# Patient Record
Sex: Male | Born: 1960 | Hispanic: No | Marital: Married | Smoking: Current every day smoker
Health system: Southern US, Community
[De-identification: ages and names within clinical notes are randomized; demographics above are authoritative.]

## PROBLEM LIST (undated history)

## (undated) ENCOUNTER — Ambulatory Visit: Source: Home / Self Care

## (undated) DIAGNOSIS — M1990 Unspecified osteoarthritis, unspecified site: Secondary | ICD-10-CM

## (undated) DIAGNOSIS — I10 Essential (primary) hypertension: Secondary | ICD-10-CM

## (undated) DIAGNOSIS — A048 Other specified bacterial intestinal infections: Secondary | ICD-10-CM

## (undated) DIAGNOSIS — F329 Major depressive disorder, single episode, unspecified: Secondary | ICD-10-CM

## (undated) DIAGNOSIS — K219 Gastro-esophageal reflux disease without esophagitis: Secondary | ICD-10-CM

## (undated) DIAGNOSIS — F32A Depression, unspecified: Secondary | ICD-10-CM

## (undated) DIAGNOSIS — G40909 Epilepsy, unspecified, not intractable, without status epilepticus: Secondary | ICD-10-CM

## (undated) HISTORY — DX: Major depressive disorder, single episode, unspecified: F32.9

## (undated) HISTORY — DX: Other specified bacterial intestinal infections: A04.8

## (undated) HISTORY — DX: Depression, unspecified: F32.A

## (undated) HISTORY — DX: Unspecified osteoarthritis, unspecified site: M19.90

## (undated) HISTORY — DX: Epilepsy, unspecified, not intractable, without status epilepticus: G40.909

## (undated) HISTORY — DX: Essential (primary) hypertension: I10

## (undated) HISTORY — PX: KNEE ARTHROSCOPY: SHX127

## (undated) HISTORY — DX: Gastro-esophageal reflux disease without esophagitis: K21.9

---

## 2000-05-27 ENCOUNTER — Emergency Department (HOSPITAL_COMMUNITY): Admission: EM | Admit: 2000-05-27 | Discharge: 2000-05-27 | Payer: Self-pay | Admitting: Emergency Medicine

## 2000-06-04 ENCOUNTER — Emergency Department (HOSPITAL_COMMUNITY): Admission: EM | Admit: 2000-06-04 | Discharge: 2000-06-04 | Payer: Self-pay | Admitting: Emergency Medicine

## 2000-07-02 ENCOUNTER — Emergency Department (HOSPITAL_COMMUNITY): Admission: EM | Admit: 2000-07-02 | Discharge: 2000-07-02 | Payer: Self-pay | Admitting: Emergency Medicine

## 2000-07-02 ENCOUNTER — Encounter: Payer: Self-pay | Admitting: Emergency Medicine

## 2000-07-29 ENCOUNTER — Emergency Department (HOSPITAL_COMMUNITY): Admission: EM | Admit: 2000-07-29 | Discharge: 2000-07-29 | Payer: Self-pay | Admitting: Emergency Medicine

## 2000-08-19 ENCOUNTER — Emergency Department (HOSPITAL_COMMUNITY): Admission: EM | Admit: 2000-08-19 | Discharge: 2000-08-19 | Payer: Self-pay | Admitting: Emergency Medicine

## 2000-08-22 ENCOUNTER — Encounter: Payer: Self-pay | Admitting: Family Medicine

## 2000-08-22 ENCOUNTER — Ambulatory Visit (HOSPITAL_COMMUNITY): Admission: RE | Admit: 2000-08-22 | Discharge: 2000-08-22 | Payer: Self-pay | Admitting: Family Medicine

## 2000-10-08 ENCOUNTER — Emergency Department (HOSPITAL_COMMUNITY): Admission: EM | Admit: 2000-10-08 | Discharge: 2000-10-08 | Payer: Self-pay | Admitting: Emergency Medicine

## 2001-01-28 ENCOUNTER — Emergency Department (HOSPITAL_COMMUNITY): Admission: EM | Admit: 2001-01-28 | Discharge: 2001-01-29 | Payer: Self-pay | Admitting: Emergency Medicine

## 2002-03-24 ENCOUNTER — Emergency Department (HOSPITAL_COMMUNITY): Admission: EM | Admit: 2002-03-24 | Discharge: 2002-03-24 | Payer: Self-pay | Admitting: Emergency Medicine

## 2002-11-13 ENCOUNTER — Emergency Department (HOSPITAL_COMMUNITY): Admission: EM | Admit: 2002-11-13 | Discharge: 2002-11-13 | Payer: Self-pay | Admitting: Emergency Medicine

## 2002-11-13 ENCOUNTER — Encounter: Payer: Self-pay | Admitting: Emergency Medicine

## 2005-03-13 ENCOUNTER — Emergency Department (HOSPITAL_COMMUNITY): Admission: EM | Admit: 2005-03-13 | Discharge: 2005-03-13 | Payer: Self-pay | Admitting: Emergency Medicine

## 2005-03-21 ENCOUNTER — Ambulatory Visit: Payer: Self-pay | Admitting: Family Medicine

## 2005-03-29 ENCOUNTER — Emergency Department (HOSPITAL_COMMUNITY): Admission: EM | Admit: 2005-03-29 | Discharge: 2005-03-29 | Payer: Self-pay | Admitting: Emergency Medicine

## 2005-07-05 ENCOUNTER — Ambulatory Visit: Payer: Self-pay | Admitting: Family Medicine

## 2007-02-12 ENCOUNTER — Encounter (INDEPENDENT_AMBULATORY_CARE_PROVIDER_SITE_OTHER): Payer: Self-pay | Admitting: *Deleted

## 2007-03-03 ENCOUNTER — Ambulatory Visit: Payer: Self-pay | Admitting: Internal Medicine

## 2007-03-04 ENCOUNTER — Ambulatory Visit: Payer: Self-pay | Admitting: *Deleted

## 2007-04-07 ENCOUNTER — Ambulatory Visit: Payer: Self-pay | Admitting: Internal Medicine

## 2007-07-03 ENCOUNTER — Ambulatory Visit: Payer: Self-pay | Admitting: Family Medicine

## 2007-11-27 ENCOUNTER — Ambulatory Visit: Payer: Self-pay | Admitting: Internal Medicine

## 2008-01-01 ENCOUNTER — Ambulatory Visit: Payer: Self-pay | Admitting: Internal Medicine

## 2008-01-07 ENCOUNTER — Ambulatory Visit (HOSPITAL_COMMUNITY): Admission: RE | Admit: 2008-01-07 | Discharge: 2008-01-07 | Payer: Self-pay | Admitting: Internal Medicine

## 2008-01-27 ENCOUNTER — Emergency Department (HOSPITAL_COMMUNITY): Admission: EM | Admit: 2008-01-27 | Discharge: 2008-01-27 | Payer: Self-pay | Admitting: Family Medicine

## 2008-02-03 ENCOUNTER — Ambulatory Visit: Payer: Self-pay | Admitting: Internal Medicine

## 2008-02-23 ENCOUNTER — Ambulatory Visit: Payer: Self-pay | Admitting: Internal Medicine

## 2008-10-21 ENCOUNTER — Ambulatory Visit: Payer: Self-pay | Admitting: Internal Medicine

## 2008-10-29 ENCOUNTER — Emergency Department (HOSPITAL_COMMUNITY): Admission: EM | Admit: 2008-10-29 | Discharge: 2008-10-29 | Payer: Self-pay | Admitting: Emergency Medicine

## 2008-11-01 ENCOUNTER — Ambulatory Visit: Payer: Self-pay | Admitting: Internal Medicine

## 2008-11-25 ENCOUNTER — Ambulatory Visit: Payer: Self-pay | Admitting: Internal Medicine

## 2009-01-25 ENCOUNTER — Ambulatory Visit: Payer: Self-pay | Admitting: Family Medicine

## 2009-02-01 ENCOUNTER — Emergency Department (HOSPITAL_COMMUNITY): Admission: EM | Admit: 2009-02-01 | Discharge: 2009-02-01 | Payer: Self-pay | Admitting: Family Medicine

## 2009-02-03 ENCOUNTER — Ambulatory Visit: Payer: Self-pay | Admitting: Internal Medicine

## 2009-02-03 LAB — CONVERTED CEMR LAB
BUN: 13 mg/dL (ref 6–23)
CO2: 22 meq/L (ref 19–32)
Calcium: 9.2 mg/dL (ref 8.4–10.5)
Chloride: 110 meq/L (ref 96–112)
Cholesterol: 178 mg/dL (ref 0–200)
Creatinine, Ser: 0.91 mg/dL (ref 0.40–1.50)
Glucose, Bld: 104 mg/dL — ABNORMAL HIGH (ref 70–99)
HDL: 53 mg/dL (ref >39)
LDL Cholesterol: 114 mg/dL — ABNORMAL HIGH (ref 0–99)
Potassium: 4.2 meq/L (ref 3.5–5.3)
Sodium: 142 meq/L (ref 135–145)
Total CHOL/HDL Ratio: 3.4
Triglycerides: 56 mg/dL (ref <150)
VLDL: 11 mg/dL (ref 0–40)

## 2009-07-26 ENCOUNTER — Ambulatory Visit: Payer: Self-pay | Admitting: Family Medicine

## 2010-09-01 LAB — POCT URINALYSIS DIP (DEVICE)
Bilirubin Urine: NEGATIVE
Glucose, UA: NEGATIVE mg/dL
Hgb urine dipstick: NEGATIVE
Ketones, ur: NEGATIVE mg/dL
Nitrite: NEGATIVE
Protein, ur: 30 mg/dL — AB
Specific Gravity, Urine: >=1.03 (ref 1.005–1.030)
Urobilinogen, UA: 0.2 mg/dL (ref 0.0–1.0)
pH: 6 (ref 5.0–8.0)

## 2010-09-04 LAB — POCT I-STAT, CHEM 8
BUN: 14 mg/dL (ref 6–23)
Creatinine, Ser: 1.2 mg/dL (ref 0.4–1.5)
Potassium: 4.3 mEq/L (ref 3.5–5.1)
Sodium: 137 mEq/L (ref 135–145)
TCO2: 23 mmol/L (ref 0–100)

## 2010-09-04 LAB — CBC
MCV: 88.7 fL (ref 78.0–100.0)
Platelets: 378 10*3/uL (ref 150–400)
WBC: 10.3 10*3/uL (ref 4.0–10.5)

## 2010-09-04 LAB — URINALYSIS, ROUTINE W REFLEX MICROSCOPIC
Nitrite: NEGATIVE
Protein, ur: NEGATIVE mg/dL
Urobilinogen, UA: 0.2 mg/dL (ref 0.0–1.0)

## 2010-09-04 LAB — COMPREHENSIVE METABOLIC PANEL
AST: 14 U/L (ref 0–37)
Albumin: 4 g/dL (ref 3.5–5.2)
Chloride: 103 mEq/L (ref 96–112)
Creatinine, Ser: 1 mg/dL (ref 0.4–1.5)
GFR calc Af Amer: >60 mL/min (ref >60)
Potassium: 4.2 mEq/L (ref 3.5–5.1)
Sodium: 136 mEq/L (ref 135–145)
Total Bilirubin: 0.4 mg/dL (ref 0.3–1.2)

## 2010-09-04 LAB — DIFFERENTIAL
Basophils Absolute: 0.1 10*3/uL (ref 0.0–0.1)
Eosinophils Relative: 1 % (ref 0–5)
Lymphocytes Relative: 18 % (ref 12–46)
Monocytes Absolute: 0.5 10*3/uL (ref 0.1–1.0)

## 2010-09-04 LAB — URINE CULTURE

## 2010-09-04 LAB — POCT CARDIAC MARKERS
CKMB, poc: <1 ng/mL — ABNORMAL LOW (ref 1.0–8.0)
Myoglobin, poc: 43.9 ng/mL (ref 12–200)
Troponin i, poc: <0.05 ng/mL (ref 0.00–0.09)

## 2010-10-07 ENCOUNTER — Inpatient Hospital Stay (INDEPENDENT_AMBULATORY_CARE_PROVIDER_SITE_OTHER)
Admission: RE | Admit: 2010-10-07 | Discharge: 2010-10-07 | Disposition: A | Discharge status: Home / Self Care | Payer: Self-pay | Source: Ambulatory Visit | Attending: Emergency Medicine | Admitting: Emergency Medicine

## 2010-10-07 DIAGNOSIS — K259 Gastric ulcer, unspecified as acute or chronic, without hemorrhage or perforation: Secondary | ICD-10-CM

## 2010-10-07 DIAGNOSIS — R10817 Generalized abdominal tenderness: Secondary | ICD-10-CM

## 2010-10-07 LAB — POCT I-STAT, CHEM 8
BUN: 7 mg/dL (ref 6–23)
Calcium, Ion: 1.13 mmol/L (ref 1.12–1.32)
Chloride: 107 mEq/L (ref 96–112)
Glucose, Bld: 101 mg/dL — ABNORMAL HIGH (ref 70–99)

## 2010-10-13 NOTE — Consult Note (Signed)
[facility]  Patient:    JAVORIS, STAR Visit Number: [id number] MRN: [id number]          Service Type: EMS Location: ED Attending Physician:  Ilene Qua Dictated by:   Lennon Alstrom Felipa Eth, M.D. Proc. Date: 01/28/01 Admit Date:  01/28/2001 Discharge Date: 01/29/2001   CC:         Dr. Ezzard Flax, [facility]   Consultation Report  REFERRING PHYSICIAN:  Dr. Christeen Douglas of the [facility] Emergency Room.  REASON FOR CONSULTATION:  This is the emergency room for altered mental status.  HISTORY OF THE PRESENT ILLNESS:  This is a 50 year old [place] male who has a long history of psychiatric disorders including depression, multiple overdoses requiring hospitalization, most recently at [facility] in April of 2002.  Indeed, patient has a significant history of alcohol abuse and indeed has had his drivers license taken away.  He has also been fired from his most recent job secondary to testing positive for [facility].  This patient has been followed by multiple psychiatrists including Dr. Isaac Bliss in [facility]. Patient recently lost his Medicaid privileges and has been recently followed up at the [facility] by Dr. Floyde Parkins and then most recently by Dr. Ezzard Flax.  Patient was most recently seen by Dr. Mila Homer this past Thursday, at the end of August, after which several of his medications were changed.  Per prescription bottles that were brought into the emergency room by the patients wife, he has been recently placed on Risperdal 0.5 mg at h.s. and Gabitril 4 mg t.i.d.  Patient also has bottles that are a few months older consisting of Depakote ER and trazodone.  Patient also has samples of Effexor XR, however, it is unclear whether he is taking this at the current time.  Apparently patient stated that he was depressed, however, does not feel like he is suicidal and took approximately nine of his Gabitril tablets  earlier this evening.  Subsequently, the patient developed significant agitation associated with hallucinations and uncontrolled behavior.  Given his altered mental status, the patients wife brought the patient into the [facility] Emergency Room for further evaluation and management.  Patient needed to be restrained at four points and controlled with the help of security personnel. Patient was given 4 mg of IM Haldol.  He was also given two bottles of activated charcoal.  This was approximately 7:34 p.m.  At the time, patient was tachycardic with a slightly elevated blood pressure of 176/93.  I was called in initially to stabilize the patient from a medical point of view prior to psychiatric assessment and evaluation.  Upon my arrival and evaluation of the patient, the patient was alert and oriented x 3, conversant, appropriate and cooperative.  Indeed, he was asking to be discharged home under the care of his wife.  REVIEW OF SYSTEMS:  Negative for fevers, chills, headache, signs or symptoms or upper respiratory tract infection, chest pain, shortness of breath, nausea, vomiting, diarrhea, constipation, acute neurological changes at the current time, or acute musculoskeletal deficits.  SOCIAL HISTORY:  The patient is married, is unemployed and has a history of tobacco and alcohol abuse.  FAMILY HISTORY:  Not obtained.  DATA:  Sodium 138, potassium 3.0, chloride 103, CO2 26, glucose 128, BUN less than 5, creatinine 1.1, calcium 8.8, total protein 7.4, albumin 4.4, AST 33, ALT 26, alkaline phosphatase 78, total bilirubin 0.8.  Alcohol level is at less than 5.  Urinalysis  unremarkable for signs of infection.  Urine toxicology screen positive for PCP.  EKG revealed sinus tachycardia with short P-R, left atrial enlargement, nonspecific T wave abnormality and normal axis.  PHYSICAL EXAMINATION:  GENERAL:  We have a well-developed male in no apparent distress, sitting up in bed,  talking in full sentences, conversant and appropriate.  VITAL SIGNS:  Blood pressure is 138/78, pulse 95, respirations 20.  Patient is afebrile.  HEENT:  Head:  Normocephalic, atraumatic.  Eye exam:  Sclerae anicteric. Extraocular movements are intact.  Pupils were equal and reactive to light and accommodation.  ENT exam:  There is no sinus tenderness.  There are no oropharyngeal lesions but darkened color throughout the oropharyngeal cavity secondary to recent activated charcoal administration.  Tympanic membranes are clear bilaterally.  NECK:  Supple.  There is no thyromegaly.  There is no cervical lymphadenopathy.  LUNGS:  Clear to auscultation bilaterally.  CARDIOVASCULAR:  Regular rate and rhythm without murmurs, rubs, or gallops appreciated in the loud emergency room.  ABDOMEN:  Slightly distended with positive bowel sounds, nontender and there is no hepatosplenomegaly appreciated on exam.  EXTREMITIES:  Exam reveals no clubbing, cyanosis, or edema, with 2+ pulses in all four extremities.  NEUROLOGIC:  Exam is grossly nonfocal.  Patient is alert and oriented x 3, follows all commands.  ASSESSMENT AND PLAN:  We have a 50 year old [place] male who has a significant past medical history of multiple psychiatric issues, followed by multiple psychiatrists, requiring hospitalization at various times.  Currently, he has overdosed apparently on Gabitril and possibly PCP and is currently medically stable after several hours of observation in the emergency room and administration of activated charcoal.  It is unclear whether the patient is actively suicidal.  Obviously, at the onset of his admission to the emergency room, the patient was very agitated and had to be restrained.  Clearly, this is not the case and the patient is alert and oriented and following all commands.  ACT team is in the process of evaluating the patient, however, currently, based on his exam, vital signs and  laboratory evaluation, the  patient is medically stable and cleared for additional psychiatric evaluation as indicated. Dictated by:   Lennon Alstrom Felipa Eth, M.D. Attending Physician:  Ilene Qua DD:  01/28/01 TD:  01/29/01 Job: [id number]

## 2010-10-25 ENCOUNTER — Encounter: Payer: Self-pay | Admitting: Internal Medicine

## 2010-12-01 ENCOUNTER — Encounter: Payer: Self-pay | Admitting: Internal Medicine

## 2010-12-01 ENCOUNTER — Ambulatory Visit (INDEPENDENT_AMBULATORY_CARE_PROVIDER_SITE_OTHER): Payer: Self-pay | Admitting: Internal Medicine

## 2010-12-01 DIAGNOSIS — R768 Other specified abnormal immunological findings in serum: Secondary | ICD-10-CM

## 2010-12-01 DIAGNOSIS — R1013 Epigastric pain: Secondary | ICD-10-CM

## 2010-12-01 DIAGNOSIS — R1084 Generalized abdominal pain: Secondary | ICD-10-CM

## 2010-12-01 DIAGNOSIS — R894 Abnormal immunological findings in specimens from other organs, systems and tissues: Secondary | ICD-10-CM

## 2010-12-01 NOTE — Progress Notes (Signed)
HISTORY OF PRESENT ILLNESS:  Daniel Adams is a 50 y.o. male with hypertension and depression. He is sent today from [facility] clinic for evaluation of chronic abdominal pain. He is accompanied by his wife. He reports a greater than one-year history of bilateral lower abdominal burning sensation superficially. Occasional epigastric burning. The discomfort is constant. May somewhat better with meals. Seemingly exacerbated by medications. No affect with bowel movements. His GI review of systems is otherwise negative. No reflux symptoms, bleeding, abnormal bowel habits, or weight loss. He has had some mild weight gain. He underwent a colonoscopy in [place] proximally one year ago. They reports this as normal. He was disappointed that post procedure, his burning sensation continued. He sees a psychiatrist for depression. He was evaluated the 12th 2012 at the time urgent care clinic. Hemoglobin and basic metabolic panel were unremarkable. Helicobacter pylori screen was positive. He was treated for H. pylori with no improvement in symptoms. They deny any imaging studies to evaluate these complaints  REVIEW OF SYSTEMS:  All non-GI ROS negative except for depression  Past Medical History  Diagnosis Date  . Hypertension   . Arthritis   . Depression   . GERD (gastroesophageal reflux disease)   . Seizure disorder   . H. pylori infection     DX blood test     Past Surgical History  Procedure Date  . Knee arthroscopy     Social History Lathan Gieselman  reports that he has been smoking.  He has never used smokeless tobacco. He reports that he does not drink alcohol or use illicit drugs.  family history includes Colon cancer in his paternal aunt.  Allergies  Allergen Reactions  . Wellbutrin (Bupropion Hcl)        PHYSICAL EXAMINATION:Vital signs: BP 132/80  Pulse 72  Ht 5\' 3"  (1.6 m)  Wt 150 lb (68.04 kg)  BMI 26.57 kg/m2  Constitutional: generally well-appearing, no acute  distress Psychiatric: alert and oriented x3, cooperative Eyes: extraocular movements intact, anicteric, conjunctiva pink Mouth: oral pharynx moist, no lesions Neck: supple no lymphadenopathy Cardiovascular: heart regular rate and rhythm, no murmur Lungs: clear to auscultation bilaterally Abdomen: soft, nontender, nondistended, no obvious ascites, no peritoneal signs, normal bowel sounds, no organomegaly Extremities: no lower extremity edema bilaterally Skin: no lesions on visible extremities Neuro: No focal deficits.    ASSESSMENT:  #1. Vague bilateral burning sensation in the lower abdomen. Nonspecific and nonprogressive without worrisome features. Doubt specific GI disorder. May be functional #2. Intermittent epigastric burning #3. Status post treatment for H. pylori #4. Negative colonoscopy reported one year previous   PLAN:  #1. Upper endoscopy with biopsy to evaluate intermittent epigastric discomfort and assess H. pylori status.The nature of the procedure, as well as the risks, benefits, and alternatives were carefully and thoroughly reviewed with the patient. Ample time for discussion and questions allowed. The patient understood, was satisfied, and agreed to proceed.  #2. Contrast-enhanced CT scan of the abdomen and pelvis to further evaluate lower abdominal discomfort #3. If the above workup negative, return to PCP for additional assessment and treatment as indicated

## 2010-12-01 NOTE — Patient Instructions (Signed)
You have been scheduled for a CT scan of the abdomen and pelvis at  CT ([address]---this is in the same building as [facility]).   You are scheduled on  12/06/10 at 3:00 pm. You should arrive 15 minutes prior to your appointment time for registration. Please follow the written instructions below on the day of your exam:  1) Do not eat or drink anything after 11:00 am (4 hours prior to your test) 2) You have been given 2 bottles of oral contrast to drink. The solution may taste better if refrigerated, but do NOT add ice or any other liquid to this solution. Shake             well before drinking.  Drink 1 bottle of contrast @ 1:00 pm (2 hours prior to your exam)  Drink 1 bottle of contrast @ 2:00 pm (1 hour prior to your exam)  The purpose of you drinking the oral contrast is to aid in the visualization of your intestinal tract. The contrast solution may cause some diarrhea. Before your exam is started, you will be given a small amount of fluid to drink. Depending on your individual set of symptoms, you may also receive an intravenous injection of x-ray contrast/dye.  If you have any questions regarding your exam or if you need to reschedule, you may call the CT department at [phone] between the hours of 8:00 am and 5:00 pm, Monday-Friday.  EGD 12/28/10 1:30 pm arrive at 12:30 pm on 4th floor  EGD Brochure given for you to read.  ________________________________________________________________________

## 2010-12-06 ENCOUNTER — Ambulatory Visit (INDEPENDENT_AMBULATORY_CARE_PROVIDER_SITE_OTHER)
Admission: RE | Admit: 2010-12-06 | Discharge: 2010-12-06 | Disposition: A | Discharge status: Home / Self Care | Payer: Self-pay | Source: Ambulatory Visit | Attending: Internal Medicine | Admitting: Internal Medicine

## 2010-12-06 DIAGNOSIS — R1013 Epigastric pain: Secondary | ICD-10-CM

## 2010-12-06 DIAGNOSIS — R1084 Generalized abdominal pain: Secondary | ICD-10-CM

## 2010-12-06 MED ORDER — IOHEXOL 300 MG/ML  SOLN
80.0000 mL | Freq: Once | INTRAMUSCULAR | Status: AC | PRN
  Administered 2010-12-06: 80 mL via INTRAVENOUS

## 2010-12-07 ENCOUNTER — Telehealth: Payer: Self-pay

## 2010-12-07 NOTE — Telephone Encounter (Signed)
Message copied by Michele Mcalpine on Thu Dec 07, 2010 11:25 AM ------      Message from: Hilarie Fredrickson      Created: Thu Dec 07, 2010  9:45 AM       Let pt know CT abdomen and pelvis is normal. Tiny incidental lung nodule needs addressed by PCP (per radiology recommendations / comments on report). Please let pt know this as well and forward to PCP. Please confirm that PCP has received and reviewed the report. Thanks

## 2010-12-07 NOTE — Telephone Encounter (Signed)
Pt aware of results per Dr. Marina Goodell. Pt reports that his pcp is Dr. Andrey Campanile with [facility]. Report sent to Dr. Andrey Campanile.

## 2010-12-27 ENCOUNTER — Telehealth: Payer: Self-pay | Admitting: *Deleted

## 2010-12-27 NOTE — Telephone Encounter (Signed)
Attempted to contact the patient numerous of times and finally spoke with him. He was originally scheduled for 12/28/10 and Eugene Garnet could not find him in the schedule.  I thought maybe I had forgotten to put him in.  I asked the patient and he stated he canceled the appointment 2 weeks ago.  It did not show the appointment was canceled in the computer so that it is where the confusion was.  Eugene Garnet was notified of this.

## 2010-12-29 ENCOUNTER — Other Ambulatory Visit: Payer: Self-pay | Admitting: Internal Medicine

## 2012-05-22 ENCOUNTER — Encounter (HOSPITAL_COMMUNITY): Payer: Self-pay

## 2012-05-22 ENCOUNTER — Emergency Department (HOSPITAL_COMMUNITY)
Admission: EM | Admit: 2012-05-22 | Discharge: 2012-05-22 | Disposition: A | Discharge status: Home / Self Care | Payer: No Typology Code available for payment source | Source: Home / Self Care

## 2012-05-22 DIAGNOSIS — G8929 Other chronic pain: Secondary | ICD-10-CM

## 2012-05-22 DIAGNOSIS — E119 Type 2 diabetes mellitus without complications: Secondary | ICD-10-CM

## 2012-05-22 DIAGNOSIS — I10 Essential (primary) hypertension: Secondary | ICD-10-CM

## 2012-05-22 DIAGNOSIS — E785 Hyperlipidemia, unspecified: Secondary | ICD-10-CM

## 2012-05-22 DIAGNOSIS — R7303 Prediabetes: Secondary | ICD-10-CM

## 2012-05-22 MED ORDER — ATORVASTATIN CALCIUM 20 MG PO TABS: 20.0000 mg | ORAL_TABLET | Freq: Every day | ORAL | Status: DC

## 2012-05-22 MED ORDER — AMLODIPINE BESYLATE 5 MG PO TABS: 5.0000 mg | ORAL_TABLET | Freq: Every day | ORAL | Status: DC

## 2012-05-22 MED ORDER — TRAMADOL HCL 50 MG PO TABS: 50.0000 mg | ORAL_TABLET | Freq: Four times a day (QID) | ORAL | Status: DC | PRN

## 2012-05-22 NOTE — ED Notes (Signed)
Former [facility] client- in need of refills of medication

## 2012-05-22 NOTE — ED Provider Notes (Addendum)
History     CSN: [id number]  Arrival date & time 05/22/12  1258   First MD Initiated Contact with Patient 05/22/12 1519      Chief Complaint  Patient presents with  . Medication Refill    (Consider location/radiation/quality/duration/timing/severity/associated sxs/prior treatment) HPI Patient comes to clinic for medication refill. Patient recently had lab work which showed LDL of 175, his hemoglobin A1c was elevated to 5.9 a fasting blood glucose of 102. Patient says that he has been taking gemfibrozil and has been out for one month. He also needs a medication refill for hypertension.  Past Medical History  Diagnosis Date  . Hypertension   . Arthritis   . Depression   . GERD (gastroesophageal reflux disease)   . Seizure disorder   . H. pylori infection     DX blood test     Past Surgical History  Procedure Date  . Knee arthroscopy     Family History  Problem Relation Age of Onset  . Colon cancer Paternal Aunt     History  Substance Use Topics  . Smoking status: Current Every Day Smoker  . Smokeless tobacco: Never Used  . Alcohol Use: No     Comment: Former      Review of Systems   Allergies  Wellbutrin  Home Medications   Current Outpatient Rx  Name  Route  Sig  Dispense  Refill  . AMLODIPINE BESYLATE 5 MG PO TABS   Oral   Take 1 tablet (5 mg total) by mouth daily.   30 tablet   3   . ATORVASTATIN CALCIUM 20 MG PO TABS   Oral   Take 1 tablet (20 mg total) by mouth daily.   30 tablet   2   . RABEPRAZOLE SODIUM 20 MG PO TBEC   Oral   Take 20 mg by mouth daily.           . TRAMADOL HCL 50 MG PO TABS   Oral   Take 1 tablet (50 mg total) by mouth every 6 (six) hours as needed.   30 tablet   2     BP 156/97  Pulse 74  Temp 97.9 F (36.6 C) (Oral)  Resp 20  SpO2 100%  Physical Exam Chest: Clear bilaterally Heart: S1-S2 regular Abdomen: Soft nontender  ED Course  Procedures (including critical care time)  Labs Reviewed - No  data to display No results found.   1. Prediabetes  2. Chronic pain   3. Hypertension    #1 prediabetes Patient's A1c is 5.9, he has been given the diet information for prediabetes. We will recheck the hemoglobin A1c in 2 months, if A1c is still elevated consider putting him on metformin to prevent  going into full blown diabetes  Chronic pain Patient will be given a prescription for tramadol when necessary  Hypertension Will give prescription for Norvasc 5 mg daily  Hyperlipidemia Will start patient on Lipitor 20 mg by mouth daily. Patient had recent blood work done and I checked h liver function tests which are normal.    MDM          Meredeth Ide, MD 05/22/12 1546  Meredeth Ide, MD 05/22/12 [phone]

## 2012-07-22 ENCOUNTER — Emergency Department (HOSPITAL_COMMUNITY)
Admission: EM | Admit: 2012-07-22 | Discharge: 2012-07-22 | Disposition: A | Discharge status: Home / Self Care | Payer: No Typology Code available for payment source | Source: Home / Self Care

## 2012-07-22 ENCOUNTER — Encounter (HOSPITAL_COMMUNITY): Payer: Self-pay | Admitting: *Deleted

## 2012-07-22 DIAGNOSIS — I10 Essential (primary) hypertension: Secondary | ICD-10-CM

## 2012-07-22 MED ORDER — TRAMADOL HCL 50 MG PO TABS: 50.0000 mg | ORAL_TABLET | Freq: Four times a day (QID) | ORAL | Status: DC | PRN

## 2012-07-22 MED ORDER — AMLODIPINE BESYLATE 5 MG PO TABS: 5.0000 mg | ORAL_TABLET | Freq: Every day | ORAL | Status: DC

## 2012-07-22 MED ORDER — ATORVASTATIN CALCIUM 20 MG PO TABS: 20.0000 mg | ORAL_TABLET | Freq: Every day | ORAL | Status: DC

## 2012-07-22 NOTE — ED Provider Notes (Signed)
History     CSN: [id number]  Arrival date & time 07/22/12  1202   First MD Initiated Contact with Patient 07/22/12 1251      Chief Complaint  Patient presents with  . Medication Refill    (Consider location/radiation/quality/duration/timing/severity/associated sxs/prior treatment) HPI 52 year old [place] male history of hypertension, low back pain, arthritis , hyperlipidemia who comes in for medication refill. He is planning to go back to [place] 4 months. Still has low back pain and has been referred for surgery in the past but because of insurance issues he is hesitating for surgery. He informs being compliant with his medications. Denies headache, blurry vision, dizziness, chest pain, palpitations, shortness of breath, abdominal pain, bowel or urinary symptoms. As he change in weight or appetite  Past Medical History  Diagnosis Date  . Hypertension   . Arthritis   . Depression   . GERD (gastroesophageal reflux disease)   . Seizure disorder   . H. pylori infection     DX blood test     Past Surgical History  Procedure Laterality Date  . Knee arthroscopy      Family History  Problem Relation Age of Onset  . Colon cancer Paternal Aunt     History  Substance Use Topics  . Smoking status: Current Every Day Smoker    Types: Cigarettes  . Smokeless tobacco: Never Used  . Alcohol Use: No     Comment: Former      Review of Systems  Allergies  Wellbutrin  Home Medications   Current Outpatient Rx  Name  Route  Sig  Dispense  Refill  . amLODipine (NORVASC) 5 MG tablet   Oral   Take 1 tablet (5 mg total) by mouth daily.   120 tablet   0   . atorvastatin (LIPITOR) 20 MG tablet   Oral   Take 1 tablet (20 mg total) by mouth daily.   120 tablet   0   . RABEprazole (ACIPHEX) 20 MG tablet   Oral   Take 20 mg by mouth daily.           . traMADol (ULTRAM) 50 MG tablet   Oral   Take 1 tablet (50 mg total) by mouth every 6 (six) hours as needed.   120 tablet   0     BP 147/93  Pulse 83  Temp(Src) 98.2 F (36.8 C) (Oral)  Resp 18  SpO2 100%  Physical Exam Middle aged male in no acute distress HEENT: No pallor, moist oral mucosa Chest: Clear to auscultation bilaterally no added sounds CVS: Normal S1 and S2, no murmurs rub or gallop Abdomen: Soft, nontender, nondistended, bowel sounds present Extremities: Warm, no edema, tenderness over low back CNS: AAO x3 ED Course  Procedures (including critical care time)  Labs Reviewed - No data to display No results found.   1. Medication refill     Hypertension Blood pressure appears stable . Give him refills for his amlodipine. Since he is going out of country for 4 months I will prescribe prescribed a medication for the same duration  Hyperlipidemia In the last 175 checked in December 2013 (was checked at an outside lab by patient himself) Patient started on Lipitor 20 mg daily and will continue for now. Since his leaving in next few days he is unable to get blood work done at this time and will be checked when he returns back in few months   Diabetes Hemoglobin A1c of 5.9 when checked in December  2013. Patient is leaving out of country in next few days cannot check a repeat labs and will check it when he returns back.  Low back pain   chronically on tramadol and I will give him prescription refill. MDM  Followup in 4 months after returning back to [place]         Eddie North, MD 07/22/12 1310

## 2012-07-22 NOTE — ED Notes (Signed)
Pt reports that he needs med refill and will be out of the country for 4 months and needs at least that much.

## 2012-11-04 ENCOUNTER — Ambulatory Visit: Payer: No Typology Code available for payment source | Attending: Family Medicine | Admitting: Family Medicine

## 2012-11-04 VITALS — BP 136/87 | HR 72 | Temp 98.8°F | Resp 16 | Ht 62.0 in | Wt 155.0 lb

## 2012-11-04 DIAGNOSIS — M549 Dorsalgia, unspecified: Secondary | ICD-10-CM

## 2012-11-04 DIAGNOSIS — M545 Low back pain, unspecified: Secondary | ICD-10-CM

## 2012-11-04 DIAGNOSIS — G8929 Other chronic pain: Secondary | ICD-10-CM

## 2012-11-04 LAB — COMPREHENSIVE METABOLIC PANEL
ALT: 13 U/L (ref 0–53)
Albumin: 4.6 g/dL (ref 3.5–5.2)
BUN: 9 mg/dL (ref 6–23)
CO2: 29 mEq/L (ref 19–32)
Calcium: 9.5 mg/dL (ref 8.4–10.5)
Chloride: 104 mEq/L (ref 96–112)
Creat: 0.94 mg/dL (ref 0.50–1.35)
Potassium: 4.8 mEq/L (ref 3.5–5.3)

## 2012-11-04 LAB — LIPID PANEL
Cholesterol: 139 mg/dL (ref 0–200)
Total CHOL/HDL Ratio: 3 Ratio

## 2012-11-04 MED ORDER — AMLODIPINE BESYLATE 5 MG PO TABS: 5.0000 mg | ORAL_TABLET | Freq: Every day | ORAL | Status: DC

## 2012-11-04 MED ORDER — ATORVASTATIN CALCIUM 20 MG PO TABS: 20.0000 mg | ORAL_TABLET | Freq: Every day | ORAL | Status: DC

## 2012-11-04 NOTE — Patient Instructions (Addendum)
Back Pain, Adult Low back pain is very common. About 1 in 5 people have back pain.The cause of low back pain is rarely dangerous. The pain often gets better over time.About half of people with a sudden onset of back pain feel better in just 2 weeks. About 8 in 10 people feel better by 6 weeks.  CAUSES Some common causes of back pain include:  Strain of the muscles or ligaments supporting the spine.  Wear and tear (degeneration) of the spinal discs.  Arthritis.  Direct injury to the back. DIAGNOSIS Most of the time, the direct cause of low back pain is not known.However, back pain can be treated effectively even when the exact cause of the pain is unknown.Answering your caregiver's questions about your overall health and symptoms is one of the most accurate ways to make sure the cause of your pain is not dangerous. If your caregiver needs more information, he or she may order lab work or imaging tests (X-rays or MRIs).However, even if imaging tests show changes in your back, this usually does not require surgery. HOME CARE INSTRUCTIONS For many people, back pain returns.Since low back pain is rarely dangerous, it is often a condition that people can learn to manageon their own.   Remain active. It is stressful on the back to sit or stand in one place. Do not sit, drive, or stand in one place for more than 30 minutes at a time. Take short walks on level surfaces as soon as pain allows.Try to increase the length of time you walk each day.  Do not stay in bed.Resting more than 1 or 2 days can delay your recovery.  Do not avoid exercise or work.Your body is made to move.It is not dangerous to be active, even though your back may hurt.Your back will likely heal faster if you return to being active before your pain is gone.  Pay attention to your body when you bend and lift. Many people have less discomfortwhen lifting if they bend their knees, keep the load close to their bodies,and  avoid twisting. Often, the most comfortable positions are those that put less stress on your recovering back.  Find a comfortable position to sleep. Use a firm mattress and lie on your side with your knees slightly bent. If you lie on your back, put a pillow under your knees.  Only take over-the-counter or prescription medicines as directed by your caregiver. Over-the-counter medicines to reduce pain and inflammation are often the most helpful.Your caregiver may prescribe muscle relaxant drugs.These medicines help dull your pain so you can more quickly return to your normal activities and healthy exercise.  Put ice on the injured area.  Put ice in a plastic bag.  Place a towel between your skin and the bag.  Leave the ice on for 15-20 minutes, 3-4 times a day for the first 2 to 3 days. After that, ice and heat may be alternated to reduce pain and spasms.  Ask your caregiver about trying back exercises and gentle massage. This may be of some benefit.  Avoid feeling anxious or stressed.Stress increases muscle tension and can worsen back pain.It is important to recognize when you are anxious or stressed and learn ways to manage it.Exercise is a great option. SEEK MEDICAL CARE IF:  You have pain that is not relieved with rest or medicine.  You have pain that does not improve in 1 week.  You have new symptoms.  You are generally not feeling well. SEEK   IMMEDIATE MEDICAL CARE IF:   You have pain that radiates from your back into your legs.  You develop new bowel or bladder control problems.  You have unusual weakness or numbness in your arms or legs.  You develop nausea or vomiting.  You develop abdominal pain.  You feel faint. Document Released: 05/14/2005 Document Revised: 11/13/2011 Document Reviewed: 10/02/2010 [facility] Patient Information 2014 [place], [place]. Cholesterol Cholesterol is a white, waxy, fat-like protein needed by your body in small amounts. The liver makes  all the cholesterol you need. It is carried from the liver by the blood through the blood vessels. Deposits (plaque) may build up on blood vessel walls. This makes the arteries narrower and stiffer. Plaque increases the risk for heart attack and stroke. You cannot feel your cholesterol level even if it is very high. The only way to know is by a blood test to check your lipid (fats) levels. Once you know your cholesterol levels, you should keep a record of the test results. Work with your caregiver to to keep your levels in the desired range. WHAT THE RESULTS MEAN:  Total cholesterol is a rough measure of all the cholesterol in your blood.  LDL is the so-called bad cholesterol. This is the type that deposits cholesterol in the walls of the arteries. You want this level to be low.  HDL is the good cholesterol because it cleans the arteries and carries the LDL away. You want this level to be high.  Triglycerides are fat that the body can either burn for energy or store. High levels are closely linked to heart disease. DESIRED LEVELS:  Total cholesterol below 200.  LDL below 100 for people at risk, below 70 for very high risk.  HDL above 50 is good, above 60 is best.  Triglycerides below 150. HOW TO LOWER YOUR CHOLESTEROL:  Diet.  Choose fish or white meat chicken and [place], roasted or baked. Limit fatty cuts of red meat, fried foods, and processed meats, such as sausage and lunch meat.  Eat lots of fresh fruits and vegetables. Choose whole grains, beans, pasta, potatoes and cereals.  Use only small amounts of olive, corn or canola oils. Avoid butter, mayonnaise, shortening or palm kernel oils. Avoid foods with trans-fats.  Use skim/nonfat milk and low-fat/nonfat yogurt and cheeses. Avoid whole milk, cream, ice cream, egg yolks and cheeses. Healthy desserts include angel food cake, ginger snaps, animal crackers, hard candy, popsicles, and low-fat/nonfat frozen yogurt. Avoid pastries,  cakes, pies and cookies.  Exercise.  A regular program helps decrease LDL and raises HDL.  Helps with weight control.  Do things that increase your activity level like gardening, walking, or taking the stairs.  Medication.  May be prescribed by your caregiver to help lowering cholesterol and the risk for heart disease.  You may need medicine even if your levels are normal if you have several risk factors. HOME CARE INSTRUCTIONS   Follow your diet and exercise programs as suggested by your caregiver.  Take medications as directed.  Have blood work done when your caregiver feels it is necessary. MAKE SURE YOU:   Understand these instructions.  Will watch your condition.  Will get help right away if you are not doing well or get worse. Document Released: 02/06/2001 Document Revised: 08/06/2011 Document Reviewed: 07/30/2007 [facility] Patient Information 2014 [place], [place].

## 2012-11-04 NOTE — Progress Notes (Signed)
Patient presents for medication refill and labs. States would like refill on tramadol.

## 2012-11-04 NOTE — Progress Notes (Signed)
Patient ID: Daniel Adams, male   DOB: [birth year], 52 y.o.   MRN: [id number]  CC: follow up   HPI: Pt is reporting that he is here to get refills of his medications.  He reports he has been doing fairly well.  He is here to get blood pressure medications refilled.  He reports that he would like to get a refill of tramadol.  He is seeing a pain management specialist which she saw yesterday and got a prescription for Vicodin.  She reports that the Vicodin is not strong enough for him.  He also reports that he needs to get an MRI ordered through this office through our [facility] card program.    Allergies  Allergen Reactions  . Wellbutrin (Bupropion Hcl)    Past Medical History  Diagnosis Date  . Hypertension   . Arthritis   . Depression   . GERD (gastroesophageal reflux disease)   . Seizure disorder   . H. pylori infection     DX blood test    Current Outpatient Prescriptions on File Prior to Visit  Medication Sig Dispense Refill  . amLODipine (NORVASC) 5 MG tablet Take 1 tablet (5 mg total) by mouth daily.  120 tablet  0  . atorvastatin (LIPITOR) 20 MG tablet Take 1 tablet (20 mg total) by mouth daily.  120 tablet  0  . traMADol (ULTRAM) 50 MG tablet Take 1 tablet (50 mg total) by mouth every 6 (six) hours as needed.  120 tablet  0  . RABEprazole (ACIPHEX) 20 MG tablet Take 20 mg by mouth daily.        . [DISCONTINUED] DULoxetine (CYMBALTA) 30 MG capsule Take 30 mg by mouth 2 (two) times daily.        . [DISCONTINUED] gabapentin (NEURONTIN) 600 MG tablet Take 600 mg by mouth 3 (three) times daily.        . [DISCONTINUED] gemfibrozil (LOPID) 600 MG tablet Take 600 mg by mouth 2 (two) times daily before a meal.        . [DISCONTINUED] lisinopril (PRINIVIL,ZESTRIL) 20 MG tablet Take 20 mg by mouth daily.        . [DISCONTINUED] QUEtiapine (SEROQUEL) 100 MG tablet Take 100 mg by mouth at bedtime.        . [DISCONTINUED] venlafaxine (EFFEXOR-XR) 150 MG 24 hr capsule Take 150 mg by mouth daily.          No current facility-administered medications on file prior to visit.   Family History  Problem Relation Age of Onset  . Colon cancer Paternal Aunt    History   Social History  . Marital Status: Married    Spouse Name: N/A    Number of Children: 1  . Years of Education: N/A   Occupational History  . Unemployed    Social History Main Topics  . Smoking status: Current Every Day Smoker    Types: Cigarettes  . Smokeless tobacco: Never Used  . Alcohol Use: No     Comment: Former  . Drug Use: No  . Sexually Active: Not on file   Other Topics Concern  . Not on file   Social History Narrative   0 caffeine drinks     Review of Systems  Constitutional: Negative for fever, chills, diaphoresis, activity change, appetite change and fatigue.  HENT: Negative for ear pain, nosebleeds, congestion, facial swelling, rhinorrhea, neck pain, neck stiffness and ear discharge.   Eyes: Negative for pain, discharge, redness, itching and visual disturbance.  Respiratory:  Negative for cough, choking, chest tightness, shortness of breath, wheezing and stridor.   Cardiovascular: Negative for chest pain, palpitations and leg swelling.  Gastrointestinal: Negative for abdominal distention.  Genitourinary: Negative for dysuria, urgency, frequency, hematuria, flank pain, decreased urine volume, difficulty urinating and dyspareunia.  Musculoskeletal: chronic lower back pain  Neurological: Negative for dizziness, tremors, seizures, syncope, facial asymmetry, speech difficulty, weakness, light-headedness, numbness and headaches.  Hematological: Negative for adenopathy. Does not bruise/bleed easily.  Psychiatric/Behavioral: Negative for hallucinations, behavioral problems, confusion, dysphoric mood, decreased concentration and agitation.    Objective:   Filed Vitals:   11/04/12 1534  BP: 136/87  Pulse: 72  Temp: 98.8 F (37.1 C)  Resp: 16    Physical Exam  Constitutional: Appears  well-developed and well-nourished. No distress.  HENT: Normocephalic. External right and left ear normal. Oropharynx is clear and moist.  Eyes: Conjunctivae and EOM are normal. PERRLA, no scleral icterus.  Neck: Normal ROM. Neck supple. No JVD. No tracheal deviation. No thyromegaly.  CVS: RRR, S1/S2 +, no murmurs, no gallops, no carotid bruit.  Pulmonary: Effort and breath sounds normal, no stridor, rhonchi, wheezes, rales.  Abdominal: Soft. BS +,  no distension, tenderness, rebound or guarding.  Musculoskeletal: pain with palpation of lumbar spine   Lymphadenopathy: No lymphadenopathy noted, cervical, inguinal. Neuro: Alert. Normal reflexes, muscle tone coordination. No cranial nerve deficit. Skin: Skin is warm and dry. No rash noted. Not diaphoretic. No erythema. No pallor.  Psychiatric: Normal mood and affect. Behavior, judgment, thought content normal.   Lab Results  Component Value Date   WBC 10.3 10/29/2008   HGB 13.9 10/07/2010   HCT 41.0 10/07/2010   MCV 88.7 10/29/2008   PLT 378 10/29/2008   Lab Results  Component Value Date   CREATININE 0.80 10/07/2010   BUN 7 10/07/2010   NA 143 10/07/2010   K 3.9 10/07/2010   CL 107 10/07/2010   CO2 22 02/03/2009    No results found for this basename: HGBA1C   Lipid Panel     Component Value Date/Time   CHOL 178 02/03/2009 2008   TRIG 56 02/03/2009 2008   HDL 53 02/03/2009 2008   CHOLHDL 3.4 Ratio 02/03/2009 2008   VLDL 11 02/03/2009 2008   LDLCALC 114* 02/03/2009 2008       Assessment and plan:   Patient Active Problem List   Diagnosis Date Noted  . Chronic back pain 11/04/2012       Dyslipidemia        Pt was asking for tramadol but is going to pain management and got a prescripiton for vicodin yesterday.  I told him that he needed to get his pain meds from his pain clinic.  I did order the MRI as requested by his pain management specialist.  Because he has the [facility] card we need to order is imaging studies through this office.  We did do that.   He was scheduled to the hospital.  In addition, we ordered his labs today.  We'll followup on his lab results.  rtc in 3-4 months  The patient was given clear instructions to go to ER or return to medical center if symptoms don't improve, worsen or new problems develop.  The patient verbalized understanding.  The patient was told to call to get lab results if they haven't heard anything in the next week.    Rodney Langton, MD, CDE, FAAFP [facility] [place], [place]

## 2012-11-05 NOTE — Progress Notes (Signed)
Quick Note:  Please inform patient that labs came back okay.  Rodney Langton, MD, CDE, FAAFP [facility] [place], [place]   ______

## 2012-11-06 ENCOUNTER — Telehealth: Payer: Self-pay | Admitting: *Deleted

## 2012-11-06 ENCOUNTER — Ambulatory Visit (HOSPITAL_COMMUNITY)
Admission: RE | Admit: 2012-11-06 | Discharge: 2012-11-06 | Disposition: A | Discharge status: Home / Self Care | Payer: No Typology Code available for payment source | Source: Ambulatory Visit | Attending: Family Medicine | Admitting: Family Medicine

## 2012-11-06 DIAGNOSIS — M545 Low back pain: Secondary | ICD-10-CM

## 2012-11-06 DIAGNOSIS — M5126 Other intervertebral disc displacement, lumbar region: Secondary | ICD-10-CM | POA: Insufficient documentation

## 2012-11-06 MED ORDER — GADOBENATE DIMEGLUMINE 529 MG/ML IV SOLN
15.0000 mL | Freq: Once | INTRAVENOUS | Status: AC
  Administered 2012-11-06: 15 mL via INTRAVENOUS

## 2012-11-06 NOTE — Telephone Encounter (Signed)
11/06/12/ Patient not available message left via telephone that labs came back Normal. P.Kasten Leveque,RN BSN [facility]

## 2012-11-06 NOTE — Telephone Encounter (Signed)
11/06/12 Spoke with patient regarding lab made aware lab came back normal And MRI results revealed bulging disc in the lumbar spine please follow up with  Pain management specialists to discuss further management. Verbalize and understands. P.WilliamsRN BSN MHA

## 2012-11-06 NOTE — Progress Notes (Signed)
Quick Note:  Please inform patient that his MRI results came back and he revealed that he does have bulging discs in the lumbar spine. There is a foraminal protrusion on the left at L5-S1. The appearance is similar to previous imaging studies. Please followup with your pain management specialist to discuss further management.   Rodney Langton, MD, CDE, FAAFP [facility] [place], [place]   ______

## 2013-02-02 ENCOUNTER — Ambulatory Visit: Payer: No Typology Code available for payment source | Attending: Internal Medicine | Admitting: Internal Medicine

## 2013-02-02 ENCOUNTER — Encounter: Payer: Self-pay | Admitting: Internal Medicine

## 2013-02-02 VITALS — BP 169/100 | HR 93 | Temp 98.7°F | Resp 18 | Ht 59.84 in | Wt 165.0 lb

## 2013-02-02 DIAGNOSIS — I10 Essential (primary) hypertension: Secondary | ICD-10-CM | POA: Insufficient documentation

## 2013-02-02 DIAGNOSIS — M549 Dorsalgia, unspecified: Secondary | ICD-10-CM | POA: Insufficient documentation

## 2013-02-02 DIAGNOSIS — K219 Gastro-esophageal reflux disease without esophagitis: Secondary | ICD-10-CM | POA: Insufficient documentation

## 2013-02-02 DIAGNOSIS — R569 Unspecified convulsions: Secondary | ICD-10-CM | POA: Insufficient documentation

## 2013-02-02 DIAGNOSIS — Z09 Encounter for follow-up examination after completed treatment for conditions other than malignant neoplasm: Secondary | ICD-10-CM | POA: Insufficient documentation

## 2013-02-02 DIAGNOSIS — G8929 Other chronic pain: Secondary | ICD-10-CM | POA: Insufficient documentation

## 2013-02-02 MED ORDER — AMLODIPINE BESYLATE 5 MG PO TABS: 10.0000 mg | ORAL_TABLET | Freq: Every day | ORAL | Status: DC

## 2013-02-02 NOTE — Progress Notes (Signed)
Patient Demographics  Daniel Adams, is a 52 y.o. male  YNW:[id number]  MVH:[id number]  DOB - [birth year]  Chief Complaint  Patient presents with  . Back Pain        Subjective:   Daniel Adams today is here for a follow up visit. He has no other complaints. He claims that his back pain is controlled with oxycodone that he is getting at the pain management clinic. He is here primarily to get his DMV forms filled out.  Patient has No headache, No chest pain, No abdominal pain - No Nausea, No new weakness tingling or numbness, No Cough - SOB.   Objective:    Filed Vitals:   02/02/13 1143  BP: 169/100  Pulse: 93  Temp: 98.7 F (37.1 C)  TempSrc: Oral  Resp: 18  Height: 4' 11.84" (1.52 m)  Weight: 165 lb (74.844 kg)  SpO2: 99%     ALLERGIES:   Allergies  Allergen Reactions  . Wellbutrin [Bupropion Hcl]     PAST MEDICAL HISTORY: Past Medical History  Diagnosis Date  . Hypertension   . Arthritis   . Depression   . GERD (gastroesophageal reflux disease)   . Seizure disorder   . H. pylori infection     DX blood test     MEDICATIONS AT HOME: Prior to Admission medications   Medication Sig Start Date End Date Taking? Authorizing Provider  amLODipine (NORVASC) 5 MG tablet Take 2 tablets (10 mg total) by mouth daily. 02/02/13  Yes Shanker Levora Dredge, MD  atorvastatin (LIPITOR) 20 MG tablet Take 1 tablet (20 mg total) by mouth daily. 11/04/12  Yes Clanford Cyndie Mull, MD  RABEprazole (ACIPHEX) 20 MG tablet Take 20 mg by mouth daily.     Yes Historical Provider, MD  traMADol (ULTRAM) 50 MG tablet Take 1 tablet (50 mg total) by mouth every 6 (six) hours as needed. 07/22/12  Yes Eddie North, MD     Exam  General appearance :Awake, alert, not in any distress. Speech Clear. Not toxic Looking HEENT: Atraumatic and Normocephalic, pupils equally reactive to light and accomodation Neck: supple, no JVD. No cervical lymphadenopathy.  Chest:Good air entry bilaterally, no added  sounds  CVS: S1 S2 regular, no murmurs.  Abdomen: Bowel sounds present, Non tender and not distended with no gaurding, rigidity or rebound. Extremities: B/L Lower Ext shows no edema, both legs are warm to touch Neurology: Awake alert, and oriented X 3, CN II-XII intact, Non focal Skin:No Rash Wounds:N/A    Data Review   CBC No results found for this basename: WBC, HGB, HCT, PLT, MCV, MCH, MCHC, RDW, NEUTRABS, LYMPHSABS, MONOABS, EOSABS, BASOSABS, BANDABS, BANDSABD,  in the last 168 hours  Chemistries   No results found for this basename: NA, K, CL, CO2, GLUCOSE, BUN, CREATININE, GFRCGP, CALCIUM, MG, AST, ALT, ALKPHOS, BILITOT,  in the last 168 hours ------------------------------------------------------------------------------------------------------------------ No results found for this basename: HGBA1C,  in the last 72 hours ------------------------------------------------------------------------------------------------------------------ No results found for this basename: CHOL, HDL, LDLCALC, TRIG, CHOLHDL, LDLDIRECT,  in the last 72 hours ------------------------------------------------------------------------------------------------------------------ No results found for this basename: TSH, T4TOTAL, FREET3, T3FREE, THYROIDAB,  in the last 72 hours ------------------------------------------------------------------------------------------------------------------ No results found for this basename: VITAMINB12, FOLATE, FERRITIN, TIBC, IRON, RETICCTPCT,  in the last 72 hours  Coagulation profile  No results found for this basename: INR, PROTIME,  in the last 168 hours    Assessment & Plan   Hypertension - Increase amlodipine to 10 mg-reassess next visit  Chronic back  pain - His pain management-apparently is on oxycodone-he has been told repeatedly that we do not give a chronic pain medications here in the clinic. We'll defer any further workup, management through his primary  pain clinic. He is aware of this.  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 He will bring all his paperwork with him at his next visit, we will review his paperwork and if appropriate we will go ahead and have him off of his paperwork. He knows that he needs to see psychiatry in ophthalmology for those 2 sections and a DMV paperwork.     Follow up in 2 weeks   The patient was given clear instructions to go to ER or return to medical center if symptoms don't improve, worsen or new problems develop. The patient verbalized understanding. The patient was told to call to get lab results if they haven't heard anything in the next week.

## 2013-02-02 NOTE — Progress Notes (Signed)
Pt is here for back pain and to have DMV forms filled out by PCP Forms are for "driving privelages" BP is 169/100... Reports he did not take his BP meds this am Denies: CP, SOB, HA, blurry vision, edema Alert w/no signs of acute distress.

## 2013-02-17 ENCOUNTER — Ambulatory Visit: Payer: No Typology Code available for payment source | Attending: Internal Medicine | Admitting: Internal Medicine

## 2013-02-17 VITALS — BP 163/107 | HR 88 | Temp 98.7°F | Resp 16 | Ht 62.0 in | Wt 164.0 lb

## 2013-02-17 DIAGNOSIS — K219 Gastro-esophageal reflux disease without esophagitis: Secondary | ICD-10-CM | POA: Insufficient documentation

## 2013-02-17 DIAGNOSIS — M549 Dorsalgia, unspecified: Secondary | ICD-10-CM | POA: Insufficient documentation

## 2013-02-17 DIAGNOSIS — G8929 Other chronic pain: Secondary | ICD-10-CM | POA: Insufficient documentation

## 2013-02-17 DIAGNOSIS — R569 Unspecified convulsions: Secondary | ICD-10-CM

## 2013-02-17 DIAGNOSIS — I10 Essential (primary) hypertension: Secondary | ICD-10-CM | POA: Insufficient documentation

## 2013-02-17 DIAGNOSIS — Z0289 Encounter for other administrative examinations: Secondary | ICD-10-CM | POA: Insufficient documentation

## 2013-02-17 MED ORDER — POTASSIUM CHLORIDE CRYS ER 20 MEQ PO TBCR: 10.0000 meq | EXTENDED_RELEASE_TABLET | Freq: Every day | ORAL | Status: DC

## 2013-02-17 MED ORDER — HYDROCHLOROTHIAZIDE 12.5 MG PO CAPS: 12.5000 mg | ORAL_CAPSULE | Freq: Every day | ORAL | Status: DC

## 2013-02-17 NOTE — Progress Notes (Signed)
Pt is here for a F/U visit. Pt wants to review his lab results. Pt brought papers for the doctor to fill out for the [facility]

## 2013-02-17 NOTE — Progress Notes (Signed)
Patient Demographics  Daniel Adams, is a 52 y.o. male  WUJ:[id number]  NFA:[id number]  DOB - [birth year]  Chief Complaint  Patient presents with  . Follow-up        Subjective:   Daniel Adams today is here for a follow up visit. Patient was last seen in this clinic approximately 2 weeks back, he was supposed to get further documentation regarding [facility], so that we could fill up his DMV paperwork. Unfortunately, in the documentation that [facility], there is no documentation regarding his seizures. He claims his last seizure was approximately 6 years back, not sure if this was related to EtOH use. Currently he has no complaints, except anxiety regarding DMV paperwork Upon review of his prior records, there is no EEG or MRI in a system.  Patient has No headache, No chest pain, No abdominal pain - No Nausea, No new weakness tingling or numbness, No Cough - SOB.  Objective:    Filed Vitals:   02/17/13 1411  BP: 163/107  Pulse: 88  Temp: 98.7 F (37.1 C)  TempSrc: Oral  Resp: 16  Height: 5\' 2"  (1.575 m)  Weight: 164 lb (74.39 kg)  SpO2: 99%     ALLERGIES:   Allergies  Allergen Reactions  . Wellbutrin [Bupropion Hcl]     PAST MEDICAL HISTORY: Past Medical History  Diagnosis Date  . Hypertension   . Arthritis   . Depression   . GERD (gastroesophageal reflux disease)   . Seizure disorder   . H. pylori infection     DX blood test     MEDICATIONS AT HOME: Prior to Admission medications   Medication Sig Start Date End Date Taking? Authorizing Provider  amLODipine (NORVASC) 5 MG tablet Take 2 tablets (10 mg total) by mouth daily. 02/02/13  Yes Reveca Desmarais Levora Dredge, MD  atorvastatin (LIPITOR) 20 MG tablet Take 1 tablet (20 mg total) by mouth daily. 11/04/12  Yes Clanford Cyndie Mull, MD  hydrochlorothiazide (MICROZIDE) 12.5 MG capsule Take 1 capsule (12.5 mg total) by mouth daily. 02/17/13   Duvall Comes Levora Dredge, MD  potassium chloride SA (K-DUR,KLOR-CON) 20 MEQ tablet Take 0.5 tablets (10 mEq total) by mouth daily. 02/17/13   Nyari Olsson Levora Dredge, MD  RABEprazole (ACIPHEX) 20 MG tablet Take 20 mg by mouth daily.      Historical Provider, MD  traMADol (ULTRAM) 50 MG tablet Take 1 tablet (50 mg total) by mouth every 6 (six) hours as needed. 07/22/12   Eddie North, MD     Exam  General appearance :Awake, alert, not in any distress. Speech Clear. Not toxic Looking HEENT: Atraumatic and Normocephalic, pupils equally reactive to light and accomodation Neck: supple, no JVD. No cervical lymphadenopathy.  Chest:Good air entry bilaterally, no added sounds  CVS: S1 S2 regular, no murmurs.  Abdomen: Bowel sounds present, Non tender and not distended with no gaurding, rigidity or rebound. Extremities: B/L Lower Ext shows no edema, both legs are warm to touch Neurology: Awake alert, and oriented X 3, CN II-XII intact, Non focal Skin:No Rash Wounds:N/A    Data Review   CBC No results found for this basename: WBC, HGB, HCT, PLT, MCV, MCH, MCHC, RDW, NEUTRABS, LYMPHSABS, MONOABS, EOSABS, BASOSABS, BANDABS, BANDSABD,  in the last 168 hours  Chemistries   No results found for this basename: NA, K, CL, CO2, GLUCOSE, BUN, CREATININE, GFRCGP, CALCIUM, MG, AST, ALT, ALKPHOS, BILITOT,  in the last 168 hours ------------------------------------------------------------------------------------------------------------------  No results found for this basename: HGBA1C,  in the last 72 hours ------------------------------------------------------------------------------------------------------------------ No results found for this basename: CHOL, HDL, LDLCALC, TRIG, CHOLHDL, LDLDIRECT,  in the last 72 hours ------------------------------------------------------------------------------------------------------------------ No results found for this basename: TSH, T4TOTAL, FREET3, T3FREE, THYROIDAB,  in the last 72  hours ------------------------------------------------------------------------------------------------------------------ No results found for this basename: VITAMINB12, FOLATE, FERRITIN, TIBC, IRON, RETICCTPCT,  in the last 72 hours  Coagulation profile  No results found for this basename: INR, PROTIME,  in the last 168 hours    Assessment & Plan   Hypertension - Uncontrolled - Continue with amlodipine 10 mg daily - Add HCTZ 12.5 mg daily - Reassess next visit  Chronic back pain  - His pain management-apparently is on oxycodone-he has been told repeatedly that we do not give a chronic pain medications here in the clinic. We'll defer any further workup, management through his primary pain clinic. He is aware of this.   Gastroesophageal reflux disease  - Stable, continue PPI   History of seizures  - claims he is seizure-free for the past 6 years- per patient-this was? Alcohol related seizures   Other issues  - He wanted a DMV form filled out for driving privileges-unfortunately, we have any documentation of his seizures here in our system, he claims that he has had MRIs/EEGs done in the past-he claims that [facility]- [facility], but none of these documentation is regarding seizures-no EEG or MRI in the system. - Difficult situation, I will refer him to neurology for further guidance in this matter.  Health Maintenance -Colonoscopy:will refer -Vaccinations:  Influenza-refused  Follow up in 1 month   The patient was given clear instructions to go to ER or return to medical center if symptoms don't improve, worsen or new problems develop. The patient verbalized understanding. The patient was told to call to get lab results if they haven't heard anything in the next week.

## 2013-03-25 ENCOUNTER — Ambulatory Visit: Payer: No Typology Code available for payment source | Attending: Internal Medicine | Admitting: Internal Medicine

## 2013-03-25 ENCOUNTER — Encounter: Payer: Self-pay | Admitting: Internal Medicine

## 2013-03-25 VITALS — BP 146/91 | HR 75 | Temp 98.2°F | Resp 16 | Ht 62.0 in | Wt 160.0 lb

## 2013-03-25 DIAGNOSIS — G8929 Other chronic pain: Secondary | ICD-10-CM | POA: Insufficient documentation

## 2013-03-25 DIAGNOSIS — M549 Dorsalgia, unspecified: Secondary | ICD-10-CM | POA: Insufficient documentation

## 2013-03-25 DIAGNOSIS — I10 Essential (primary) hypertension: Secondary | ICD-10-CM | POA: Insufficient documentation

## 2013-03-25 NOTE — Progress Notes (Signed)
Pt is here today requesting a referral to pain management. Pt reports having constant lower back for 20 years. Pt is needing paperwork signed by the provider for the [facility] regarding his seizures. He stated that he has not had a seizure since 2008.

## 2013-03-25 NOTE — Progress Notes (Signed)
Patient ID: Daniel Adams, male   DOB: 1960/09/07, 52 y.o.   MRN: [id number] Patient Demographics  Daniel Adams, is a 52 y.o. male  NFA:[id number]  ION:[id number]  DOB - November 28, 1960  Chief Complaint  Patient presents with  . Follow-up        Subjective:   Daniel Adams with History of chronic back pain with mild L-spine disc protrusion on the left side, back pain has been present for 20 years, it's mostly present when he stands up and is relieved when he sitting. Some radiation to the right leg. No weakness in lower extremities no bowel bladder incontinence. Had alcohol withdrawal seizure more than 6 years ago, seizure-free since then, no headache no chest pain no palpitations no cough phlegm shortness of breath. No history of CAD or CHF. No other health issues except hypertension and dyslipidemia  Denies any subjective complaints except as above, no active headache, no chest abdominal pain at this time, not short of breath. No focal weakness which is new.   Objective:    Patient Active Problem List   Diagnosis Date Noted  . HTN (hypertension) 03/25/2013  . Chronic back pain 11/04/2012     Filed Vitals:   03/25/13 1220  BP: 146/91  Pulse: 75  Temp: 98.2 F (36.8 C)  TempSrc: Oral  Resp: 16  Height: 5\' 2"  (1.575 m)  Weight: 160 lb (72.576 kg)  SpO2: 97%     Exam   Awake Alert, Oriented X 3, No new F.N deficits, Normal affect .AT,PERRAL Supple Neck,No JVD, No cervical lymphadenopathy appriciated.  Symmetrical Chest wall movement, Good air movement bilaterally, CTAB RRR,No Gallops,Rubs or new Murmurs, No Parasternal Heave +ve B.Sounds, Abd Soft, Non tender, No organomegaly appriciated, No rebound - guarding or rigidity. No Cyanosis, Clubbing or edema, No new Rash or bruise      Data Review   Lab Results  Component Value Date   WBC 10.3 10/29/2008   HGB 13.9 10/07/2010   HCT 41.0 10/07/2010   MCV 88.7 10/29/2008   PLT 378 10/29/2008      Chemistry      Component  Value Date/Time   NA 138 11/04/2012 1625   K 4.8 11/04/2012 1625   CL 104 11/04/2012 1625   CO2 29 11/04/2012 1625   BUN 9 11/04/2012 1625   CREATININE 0.94 11/04/2012 1625   CREATININE 0.80 10/07/2010 1704      Component Value Date/Time   CALCIUM 9.5 11/04/2012 1625   ALKPHOS 70 11/04/2012 1625   AST 15 11/04/2012 1625   ALT 13 11/04/2012 1625   BILITOT 0.4 11/04/2012 1625       No results found for this basename: HGBA1C    Lab Results  Component Value Date   CHOL 139 11/04/2012   HDL 47 11/04/2012   LDLCALC 78 11/04/2012   TRIG 72 11/04/2012   CHOLHDL 3.0 11/04/2012    No results found for this basename: TSH    No results found for this basename: PSA      Prior to Admission medications   Medication Sig Start Date End Date Taking? Authorizing Provider  amLODipine (NORVASC) 5 MG tablet Take 2 tablets (10 mg total) by mouth daily. 02/02/13  Yes Shanker Levora Dredge, MD  atorvastatin (LIPITOR) 20 MG tablet Take 1 tablet (20 mg total) by mouth daily. 11/04/12  Yes Clanford Cyndie Mull, MD  hydrochlorothiazide (MICROZIDE) 12.5 MG capsule Take 1 capsule (12.5 mg total) by mouth daily. 02/17/13   Shanker Levora Dredge, MD  potassium chloride SA (K-DUR,KLOR-CON) 20 MEQ tablet Take 0.5 tablets (10 mEq total) by mouth daily. 02/17/13   Shanker Levora Dredge, MD  RABEprazole (ACIPHEX) 20 MG tablet Take 20 mg by mouth daily.      Historical Provider, MD  traMADol (ULTRAM) 50 MG tablet Take 1 tablet (50 mg total) by mouth every 6 (six) hours as needed. 07/22/12   Nishant Dhungel, MD     Assessment & Plan    Chronic back pain with some disc prolapse in the L-spine area per MRI. No lower extremity weakness, no bowel bladder incontinence, some radiation of pain to the right leg however MRI suggests left-sided disc protrusion. We'll refer to neurosurgery    DMV paperwork. General health section has been filled, he has had paperwork done by psychiatrist, neurologist and ophthalmologist  separately.   Hypertension and dyslipidemia. Stable continue home medications unchanged.    Routine health maintenance.  Refused flu shot   Leroy Sea M.D on 03/25/2013 at 12:36 PM

## 2013-03-26 DIAGNOSIS — R569 Unspecified convulsions: Secondary | ICD-10-CM | POA: Insufficient documentation

## 2013-05-07 ENCOUNTER — Encounter: Payer: Self-pay | Admitting: Internal Medicine

## 2013-05-07 ENCOUNTER — Ambulatory Visit: Payer: No Typology Code available for payment source | Attending: Internal Medicine | Admitting: Internal Medicine

## 2013-05-07 VITALS — BP 159/84 | HR 76 | Temp 98.1°F | Resp 16 | Wt 165.2 lb

## 2013-05-07 DIAGNOSIS — R590 Localized enlarged lymph nodes: Secondary | ICD-10-CM

## 2013-05-07 DIAGNOSIS — E785 Hyperlipidemia, unspecified: Secondary | ICD-10-CM | POA: Insufficient documentation

## 2013-05-07 DIAGNOSIS — I10 Essential (primary) hypertension: Secondary | ICD-10-CM

## 2013-05-07 DIAGNOSIS — R599 Enlarged lymph nodes, unspecified: Secondary | ICD-10-CM

## 2013-05-07 LAB — COMPLETE METABOLIC PANEL WITH GFR
AST: 13 U/L (ref 0–37)
Albumin: 4.6 g/dL (ref 3.5–5.2)
Alkaline Phosphatase: 71 U/L (ref 39–117)
BUN: 6 mg/dL (ref 6–23)
Calcium: 9.5 mg/dL (ref 8.4–10.5)
Chloride: 102 mEq/L (ref 96–112)
GFR, Est Non African American: >89 mL/min
Glucose, Bld: 91 mg/dL (ref 70–99)
Potassium: 3.8 mEq/L (ref 3.5–5.3)
Sodium: 139 mEq/L (ref 135–145)
Total Protein: 6.8 g/dL (ref 6.0–8.3)

## 2013-05-07 LAB — LIPID PANEL
Cholesterol: 236 mg/dL — ABNORMAL HIGH (ref 0–200)
Total CHOL/HDL Ratio: 5.2 Ratio
VLDL: 30 mg/dL (ref 0–40)

## 2013-05-07 MED ORDER — AMOXICILLIN 500 MG PO CAPS: 500.0000 mg | ORAL_CAPSULE | Freq: Three times a day (TID) | ORAL | Status: DC

## 2013-05-07 NOTE — Progress Notes (Signed)
Patient ID: Daniel Adams, male   DOB: 07/18/1960, 52 y.o.   MRN: [id number] MRN: [id number] Name: Bentley Fissel  Sex: male Age: 52 y.o. DOB: [birth year]  Allergies: Wellbutrin  Chief Complaint  Patient presents with  . Adenopathy    HPI: Patient is 52 y.o. male who comes today reported to have swollen glands in the neck, as per patient his wife was sick recently he denies any fever chills any stuffy nose runny nose sore throat chest pain or shortness of breath, patient has history of hypertension but did not take his medication today.  Past Medical History  Diagnosis Date  . Hypertension   . Arthritis   . Depression   . GERD (gastroesophageal reflux disease)   . Seizure disorder   . H. pylori infection     DX blood test     Past Surgical History  Procedure Laterality Date  . Knee arthroscopy        Medication List       This list is accurate as of: 05/07/13 12:03 PM.  Always use your most recent med list.               amLODipine 5 MG tablet  Commonly known as:  NORVASC  Take 2 tablets (10 mg total) by mouth daily.     amoxicillin 500 MG capsule  Commonly known as:  AMOXIL  Take 1 capsule (500 mg total) by mouth 3 (three) times daily.     atorvastatin 20 MG tablet  Commonly known as:  LIPITOR  Take 1 tablet (20 mg total) by mouth daily.     hydrochlorothiazide 12.5 MG capsule  Commonly known as:  MICROZIDE  Take 1 capsule (12.5 mg total) by mouth daily.     potassium chloride SA 20 MEQ tablet  Commonly known as:  K-DUR,KLOR-CON  Take 0.5 tablets (10 mEq total) by mouth daily.     RABEprazole 20 MG tablet  Commonly known as:  ACIPHEX  Take 20 mg by mouth daily.     traMADol 50 MG tablet  Commonly known as:  ULTRAM  Take 1 tablet (50 mg total) by mouth every 6 (six) hours as needed.        Meds ordered this encounter  Medications  . amoxicillin (AMOXIL) 500 MG capsule    Sig: Take 1 capsule (500 mg total) by mouth 3 (three) times daily.   Dispense:  30 capsule    Refill:  0     There is no immunization history on file for this patient.  History  Substance Use Topics  . Smoking status: Current Every Day Smoker    Types: Cigarettes  . Smokeless tobacco: Never Used  . Alcohol Use: No     Comment: Former    Review of Systems  As noted in HPI  Filed Vitals:   05/07/13 1141  BP: 159/84  Pulse: 76  Temp: 98.1 F (36.7 C)  Resp: 16    Physical Exam  Physical Exam  Constitutional: He is oriented to person, place, and time.  HENT:  Minimal pharyngeal erythema no exudate   Eyes: EOM are normal. Pupils are equal, round, and reactive to light.  Neck:  Tender lymph node palpable left > right   Cardiovascular: Normal rate and regular rhythm.   Pulmonary/Chest: Breath sounds normal. No respiratory distress. He has no wheezes. He has no rales.  Neurological: He is alert and oriented to person, place, and time.    CBC    Component  Value Date/Time   WBC 10.3 10/29/2008 1548   RBC 4.59 10/29/2008 1548   HGB 13.9 10/07/2010 1704   HCT 41.0 10/07/2010 1704   PLT 378 10/29/2008 1548   MCV 88.7 10/29/2008 1548   LYMPHSABS 1.9 10/29/2008 1548   MONOABS 0.5 10/29/2008 1548   EOSABS 0.1 10/29/2008 1548   BASOSABS 0.1 10/29/2008 1548    CMP     Component Value Date/Time   NA 138 11/04/2012 1625   K 4.8 11/04/2012 1625   CL 104 11/04/2012 1625   CO2 29 11/04/2012 1625   GLUCOSE 93 11/04/2012 1625   BUN 9 11/04/2012 1625   CREATININE 0.94 11/04/2012 1625   CREATININE 0.80 10/07/2010 1704   CALCIUM 9.5 11/04/2012 1625   PROT 6.7 11/04/2012 1625   ALBUMIN 4.6 11/04/2012 1625   AST 15 11/04/2012 1625   ALT 13 11/04/2012 1625   ALKPHOS 70 11/04/2012 1625   BILITOT 0.4 11/04/2012 1625   GFRNONAA >60 10/29/2008 1548   GFRAA  Value: >60        The eGFR has been calculated using the MDRD equation. This calculation has not been validated in all clinical situations. eGFR's persistently <60 mL/min signify possible Chronic Kidney Disease. 10/29/2008  1548    Lab Results  Component Value Date/Time   CHOL 139 11/04/2012  4:25 PM    No components found with this basename: hga1c    Lab Results  Component Value Date/Time   AST 15 11/04/2012  4:25 PM    Assessment and Plan  HTN (hypertension) - Plan: Advised patient follow salt diet continue with current medications COMPLETE METABOLIC PANEL WITH GFR  Other and unspecified hyperlipidemia - Plan: Continue with her Lipitor will check Lipid panel  Adenopathy, cervical - Plan: Advised patient for salt water gargles prescribed amoxicillin (AMOXIL) 500 MG capsule    Return in about 4 weeks (around 06/04/2013).  Doris Cheadle, MD

## 2013-05-07 NOTE — Progress Notes (Signed)
Patient complains of having swollen glands for the last two weeks Denies any sore throat  Would like blood work as well

## 2013-05-08 ENCOUNTER — Telehealth: Payer: Self-pay | Admitting: Emergency Medicine

## 2013-05-08 NOTE — Telephone Encounter (Signed)
Pt given lab results and instructions to continue taking Lipitor. Repeat blood work in [employer]

## 2013-05-08 NOTE — Telephone Encounter (Signed)
Message copied by Darlis Loan on Fri May 08, 2013 11:17 AM ------      Message from: Doris Cheadle      Created: Fri May 08, 2013 10:31 AM       Blood work reviewed, noticed elevated cholesterol, advise patient for low fat diet.             ------

## 2013-06-04 ENCOUNTER — Ambulatory Visit: Payer: Self-pay | Admitting: Family Medicine

## 2013-06-11 ENCOUNTER — Ambulatory Visit: Payer: Self-pay | Admitting: Family Medicine

## 2013-06-11 ENCOUNTER — Ambulatory Visit: Payer: Self-pay | Admitting: Internal Medicine

## 2013-06-12 ENCOUNTER — Encounter: Payer: Self-pay | Admitting: Internal Medicine

## 2013-06-12 ENCOUNTER — Ambulatory Visit: Payer: Medicaid Other | Attending: Internal Medicine | Admitting: Internal Medicine

## 2013-06-12 VITALS — BP 150/100 | HR 70 | Temp 98.2°F | Resp 16 | Wt 163.0 lb

## 2013-06-12 DIAGNOSIS — I10 Essential (primary) hypertension: Secondary | ICD-10-CM | POA: Insufficient documentation

## 2013-06-12 DIAGNOSIS — E785 Hyperlipidemia, unspecified: Secondary | ICD-10-CM | POA: Insufficient documentation

## 2013-06-12 DIAGNOSIS — M549 Dorsalgia, unspecified: Secondary | ICD-10-CM | POA: Insufficient documentation

## 2013-06-12 DIAGNOSIS — G8929 Other chronic pain: Secondary | ICD-10-CM | POA: Insufficient documentation

## 2013-06-12 MED ORDER — AMLODIPINE BESYLATE 10 MG PO TABS: 10.0000 mg | ORAL_TABLET | Freq: Every day | ORAL | Status: DC

## 2013-06-12 MED ORDER — TRAMADOL HCL 50 MG PO TABS: 50.0000 mg | ORAL_TABLET | Freq: Four times a day (QID) | ORAL | Status: DC | PRN

## 2013-06-12 MED ORDER — CYCLOBENZAPRINE HCL 10 MG PO TABS: 10.0000 mg | ORAL_TABLET | Freq: Every day | ORAL | Status: DC

## 2013-06-12 NOTE — Progress Notes (Signed)
MRN: [id number] Name: Lain Tetterton  Sex: male Age: 53 y.o. DOB: 10/17/1960  Allergies: Wellbutrin  Chief Complaint  Patient presents with  . Back Pain    HPI: Patient is 53 y.o. male who comes today for followup history of hypertension hyperlipidemia, chronic back pain, patient is to follow up with pain management as per patient he cannot afford and would like [place] to give him pain medication, as per patient he has used tramadol in the past, he denies any headache dizziness chest and shortness of breath any urinary or stool incontinence, today's blood pressure is elevated and for the past few consecutive blood pressure readings have been high. Patient is on amlodipine 5 mg. Past Medical History  Diagnosis Date  . Hypertension   . Arthritis   . Depression   . GERD (gastroesophageal reflux disease)   . Seizure disorder   . H. pylori infection     DX blood test     Past Surgical History  Procedure Laterality Date  . Knee arthroscopy        Medication List       This list is accurate as of: 06/12/13  1:57 PM.  Always use your most recent med list.               amLODipine 10 MG tablet  Commonly known as:  NORVASC  Take 1 tablet (10 mg total) by mouth daily.     amoxicillin 500 MG capsule  Commonly known as:  AMOXIL  Take 1 capsule (500 mg total) by mouth 3 (three) times daily.     atorvastatin 20 MG tablet  Commonly known as:  LIPITOR  Take 1 tablet (20 mg total) by mouth daily.     cyclobenzaprine 10 MG tablet  Commonly known as:  FLEXERIL  Take 1 tablet (10 mg total) by mouth at bedtime.     hydrochlorothiazide 12.5 MG capsule  Commonly known as:  MICROZIDE  Take 1 capsule (12.5 mg total) by mouth daily.     potassium chloride SA 20 MEQ tablet  Commonly known as:  K-DUR,KLOR-CON  Take 0.5 tablets (10 mEq total) by mouth daily.     RABEprazole 20 MG tablet  Commonly known as:  ACIPHEX  Take 20 mg by mouth daily.     traMADol 50 MG tablet  Commonly known  as:  ULTRAM  Take 1 tablet (50 mg total) by mouth every 6 (six) hours as needed.        Meds ordered this encounter  Medications  . traMADol (ULTRAM) 50 MG tablet    Sig: Take 1 tablet (50 mg total) by mouth every 6 (six) hours as needed.    Dispense:  120 tablet    Refill:  0  . amLODipine (NORVASC) 10 MG tablet    Sig: Take 1 tablet (10 mg total) by mouth daily.    Dispense:  30 tablet    Refill:  5  . cyclobenzaprine (FLEXERIL) 10 MG tablet    Sig: Take 1 tablet (10 mg total) by mouth at bedtime.    Dispense:  30 tablet    Refill:  3     There is no immunization history on file for this patient.  Family History  Problem Relation Age of Onset  . Colon cancer Paternal Aunt     History  Substance Use Topics  . Smoking status: Current Every Day Smoker    Types: Cigarettes  . Smokeless tobacco: Never Used  . Alcohol Use: No  Comment: Former    Review of Systems  As noted in HPI  Filed Vitals:   06/12/13 1252  BP: 150/100  Pulse:   Temp:   Resp:     Physical Exam  Physical Exam  Constitutional: No distress.  Eyes: EOM are normal. Pupils are equal, round, and reactive to light.  Cardiovascular: Normal rate and regular rhythm.   Pulmonary/Chest: Breath sounds normal. No respiratory distress. He has no wheezes. He has no rales.  Musculoskeletal:  Low lumbar spinal and spinal tenderness     CBC    Component Value Date/Time   WBC 10.3 10/29/2008 1548   RBC 4.59 10/29/2008 1548   HGB 13.9 10/07/2010 1704   HCT 41.0 10/07/2010 1704   PLT 378 10/29/2008 1548   MCV 88.7 10/29/2008 1548   LYMPHSABS 1.9 10/29/2008 1548   MONOABS 0.5 10/29/2008 1548   EOSABS 0.1 10/29/2008 1548   BASOSABS 0.1 10/29/2008 1548    CMP     Component Value Date/Time   NA 139 05/07/2013 1201   K 3.8 05/07/2013 1201   CL 102 05/07/2013 1201   CO2 29 05/07/2013 1201   GLUCOSE 91 05/07/2013 1201   BUN 6 05/07/2013 1201   CREATININE 0.88 05/07/2013 1201   CREATININE 0.80 10/07/2010 1704    CALCIUM 9.5 05/07/2013 1201   PROT 6.8 05/07/2013 1201   ALBUMIN 4.6 05/07/2013 1201   AST 13 05/07/2013 1201   ALT 18 05/07/2013 1201   ALKPHOS 71 05/07/2013 1201   BILITOT 0.5 05/07/2013 1201   GFRNONAA >60 10/29/2008 1548   GFRAA  Value: >60        The eGFR has been calculated using the MDRD equation. This calculation has not been validated in all clinical situations. eGFR's persistently <60 mL/min signify possible Chronic Kidney Disease. 10/29/2008 1548    Lab Results  Component Value Date/Time   CHOL 236* 05/07/2013 12:01 PM    No components found with this basename: hga1c    Lab Results  Component Value Date/Time   AST 13 05/07/2013 12:01 PM    Assessment and Plan  Chronic back pain - Plan: I have given patient refill on traMADol (ULTRAM) 50 MG tablet, he's going to try cyclobenzaprine (FLEXERIL) 10 MG tablet  HTN (hypertension) uncontrolled- Plan: I have increased  (NORVASC) to 10 MG tablet, also advise for low salt diet  Other and unspecified hyperlipidemia  Continue with Lipitor 20 mg daily repeat lipid profile on the next visit.  Return in about 3 months (around 09/10/2013).  Lorayne Marek, MD

## 2013-06-12 NOTE — Progress Notes (Signed)
Patient here for follow up on his back pain States can not afford the 325.00 co pay at pain management Would like something for pain

## 2013-06-15 ENCOUNTER — Ambulatory Visit: Payer: No Typology Code available for payment source | Attending: Internal Medicine

## 2013-06-15 ENCOUNTER — Ambulatory Visit: Payer: No Typology Code available for payment source

## 2013-06-22 ENCOUNTER — Ambulatory Visit: Payer: No Typology Code available for payment source | Attending: Internal Medicine

## 2013-06-22 DIAGNOSIS — Z0000 Encounter for general adult medical examination without abnormal findings: Secondary | ICD-10-CM

## 2013-06-23 LAB — TESTOSTERONE: Testosterone: 352 ng/dL (ref 300–890)

## 2013-06-24 ENCOUNTER — Telehealth: Payer: Self-pay | Admitting: *Deleted

## 2013-06-24 NOTE — Telephone Encounter (Signed)
Pt can not make an appointment until April. [place]

## 2013-06-24 NOTE — Telephone Encounter (Signed)
Message copied by Sheresa Cullop, [place] on Wed Jun 24, 2013  2:16 PM ------      Message from: Quentin AngstJEGEDE, OLUGBEMIGA E      Created: Tue Jun 23, 2013  4:02 PM       Please inform patient that his testosterone level is normal. He can make appointment to discuss management options for his erectile dysfunction ------

## 2013-09-10 ENCOUNTER — Ambulatory Visit: Payer: Self-pay | Admitting: Internal Medicine

## 2013-12-09 ENCOUNTER — Ambulatory Visit: Payer: Medicaid Other | Attending: Internal Medicine | Admitting: Internal Medicine

## 2013-12-09 ENCOUNTER — Encounter: Payer: Self-pay | Admitting: Internal Medicine

## 2013-12-09 VITALS — BP 141/95 | HR 72 | Temp 98.2°F | Resp 16 | Wt 147.2 lb

## 2013-12-09 DIAGNOSIS — M549 Dorsalgia, unspecified: Secondary | ICD-10-CM

## 2013-12-09 DIAGNOSIS — E785 Hyperlipidemia, unspecified: Secondary | ICD-10-CM

## 2013-12-09 DIAGNOSIS — Z0000 Encounter for general adult medical examination without abnormal findings: Secondary | ICD-10-CM | POA: Insufficient documentation

## 2013-12-09 DIAGNOSIS — F17200 Nicotine dependence, unspecified, uncomplicated: Secondary | ICD-10-CM | POA: Diagnosis not present

## 2013-12-09 DIAGNOSIS — I10 Essential (primary) hypertension: Secondary | ICD-10-CM

## 2013-12-09 DIAGNOSIS — K219 Gastro-esophageal reflux disease without esophagitis: Secondary | ICD-10-CM | POA: Diagnosis not present

## 2013-12-09 DIAGNOSIS — G40909 Epilepsy, unspecified, not intractable, without status epilepticus: Secondary | ICD-10-CM | POA: Insufficient documentation

## 2013-12-09 DIAGNOSIS — G8929 Other chronic pain: Secondary | ICD-10-CM

## 2013-12-09 LAB — LIPID PANEL
CHOLESTEROL: 220 mg/dL — AB (ref 0–200)
HDL: 46 mg/dL (ref >39)
LDL Cholesterol: 150 mg/dL — ABNORMAL HIGH (ref 0–99)
TRIGLYCERIDES: 119 mg/dL (ref <150)
Total CHOL/HDL Ratio: 4.8 Ratio
VLDL: 24 mg/dL (ref 0–40)

## 2013-12-09 LAB — COMPLETE METABOLIC PANEL WITH GFR
ALT: 21 U/L (ref 0–53)
AST: 14 U/L (ref 0–37)
Albumin: 4.5 g/dL (ref 3.5–5.2)
Alkaline Phosphatase: 74 U/L (ref 39–117)
BUN: 11 mg/dL (ref 6–23)
CALCIUM: 9 mg/dL (ref 8.4–10.5)
CHLORIDE: 101 meq/L (ref 96–112)
CO2: 27 meq/L (ref 19–32)
CREATININE: 0.8 mg/dL (ref 0.50–1.35)
Glucose, Bld: 98 mg/dL (ref 70–99)
Potassium: 4.4 mEq/L (ref 3.5–5.3)
Sodium: 136 mEq/L (ref 135–145)
Total Bilirubin: 0.4 mg/dL (ref 0.2–1.2)
Total Protein: 6.5 g/dL (ref 6.0–8.3)

## 2013-12-09 MED ORDER — ATORVASTATIN CALCIUM 20 MG PO TABS: 20.0000 mg | ORAL_TABLET | Freq: Every day | ORAL | Status: DC

## 2013-12-09 NOTE — Progress Notes (Signed)
Patient here for follow up Needs referral to pain management

## 2013-12-09 NOTE — Patient Instructions (Addendum)
DASH Eating Plan DASH stands for "Dietary Approaches to Stop Hypertension." The DASH eating plan is a healthy eating plan that has been shown to reduce high blood pressure (hypertension). Additional health benefits may include reducing the risk of type 2 diabetes mellitus, heart disease, and stroke. The DASH eating plan may also help with weight loss. WHAT DO I NEED TO KNOW ABOUT THE DASH EATING PLAN? For the DASH eating plan, you will follow these general guidelines:  Choose foods with a percent daily value for sodium of less than 5% (as listed on the food label).  Use salt-free seasonings or herbs instead of table salt or sea salt.  Check with your health care provider or pharmacist before using salt substitutes.  Eat lower-sodium products, often labeled as "lower sodium" or "no salt added."  Eat fresh foods.  Eat more vegetables, fruits, and low-fat dairy products.  Choose whole grains. Look for the word "whole" as the first word in the ingredient list.  Choose fish and skinless chicken or turkey more often than red meat. Limit fish, poultry, and meat to 6 oz (170 g) each day.  Limit sweets, desserts, sugars, and sugary drinks.  Choose heart-healthy fats.  Limit cheese to 1 oz (28 g) per day.  Eat more home-cooked food and less restaurant, buffet, and fast food.  Limit fried foods.  Cook foods using methods other than frying.  Limit canned vegetables. If you do use them, rinse them well to decrease the sodium.  When eating at a restaurant, ask that your food be prepared with less salt, or no salt if possible. WHAT FOODS CAN I EAT? Seek help from a dietitian for individual calorie needs. Grains Whole grain or whole wheat bread. Brown rice. Whole grain or whole wheat pasta. Quinoa, bulgur, and whole grain cereals. Low-sodium cereals. Corn or whole wheat flour tortillas. Whole grain cornbread. Whole grain crackers. Low-sodium crackers. Vegetables Fresh or frozen vegetables  (raw, steamed, roasted, or grilled). Low-sodium or reduced-sodium tomato and vegetable juices. Low-sodium or reduced-sodium tomato sauce and paste. Low-sodium or reduced-sodium canned vegetables.  Fruits All fresh, canned (in natural juice), or frozen fruits. Meat and Other Protein Products Ground beef (85% or leaner), grass-fed beef, or beef trimmed of fat. Skinless chicken or turkey. Ground chicken or turkey. Pork trimmed of fat. All fish and seafood. Eggs. Dried beans, peas, or lentils. Unsalted nuts and seeds. Unsalted canned beans. Dairy Low-fat dairy products, such as skim or 1% milk, 2% or reduced-fat cheeses, low-fat ricotta or cottage cheese, or plain low-fat yogurt. Low-sodium or reduced-sodium cheeses. Fats and Oils Tub margarines without trans fats. Light or reduced-fat mayonnaise and salad dressings (reduced sodium). Avocado. Safflower, olive, or canola oils. Natural peanut or almond butter. Other Unsalted popcorn and pretzels. The items listed above may not be a complete list of recommended foods or beverages. Contact your dietitian for more options. WHAT FOODS ARE NOT RECOMMENDED? Grains White bread. White pasta. White rice. Refined cornbread. Bagels and croissants. Crackers that contain trans fat. Vegetables Creamed or fried vegetables. Vegetables in a cheese sauce. Regular canned vegetables. Regular canned tomato sauce and paste. Regular tomato and vegetable juices. Fruits Dried fruits. Canned fruit in light or heavy syrup. Fruit juice. Meat and Other Protein Products Fatty cuts of meat. Ribs, chicken wings, bacon, sausage, bologna, salami, chitterlings, fatback, hot dogs, bratwurst, and packaged luncheon meats. Salted nuts and seeds. Canned beans with salt. Dairy Whole or 2% milk, cream, half-and-half, and cream cheese. Whole-fat or sweetened yogurt. Full-fat   cheeses or blue cheese. Nondairy creamers and whipped toppings. Processed cheese, cheese spreads, or cheese  curds. Condiments Onion and garlic salt, seasoned salt, table salt, and sea salt. Canned and packaged gravies. Worcestershire sauce. Tartar sauce. Barbecue sauce. Teriyaki sauce. Soy sauce, including reduced sodium. Steak sauce. Fish sauce. Oyster sauce. Cocktail sauce. Horseradish. Ketchup and mustard. Meat flavorings and tenderizers. Bouillon cubes. Hot sauce. Tabasco sauce. Marinades. Taco seasonings. Relishes. Fats and Oils Butter, stick margarine, lard, shortening, ghee, and bacon fat. Coconut, palm kernel, or palm oils. Regular salad dressings. Other Pickles and olives. Salted popcorn and pretzels. The items listed above may not be a complete list of foods and beverages to avoid. Contact your dietitian for more information. WHERE CAN I FIND MORE INFORMATION? National Heart, Lung, and Blood Institute: www.nhlbi.nih.gov/health/health-topics/topics/dash/ Document Released: 05/03/2011 Document Revised: 05/19/2013 Document Reviewed: 03/18/2013 Exit[employer] Patient Information 2015 [employer]. This information is not intended to replace advice given to you by your health care provider. Make sure you discuss any questions you have with your health care provider. Fat and Cholesterol Control Diet Fat and cholesterol levels in your blood and organs are influenced by your diet. High levels of fat and cholesterol may lead to diseases of the heart, small and large blood vessels, gallbladder, liver, and pancreas. CONTROLLING FAT AND CHOLESTEROL WITH DIET Although exercise and lifestyle factors are important, your diet is key. That is because certain foods are known to raise cholesterol and others to lower it. The goal is to balance foods for their effect on cholesterol and more importantly, to replace saturated and trans fat with other types of fat, such as monounsaturated fat, polyunsaturated fat, and omega-3 fatty acids. On average, a person should consume no more than 15 to 17 g of saturated fat daily.  Saturated and trans fats are considered "bad" fats, and they will raise LDL cholesterol. Saturated fats are primarily found in animal products such as meats, butter, and cream. However, that does not mean you need to give up all your favorite foods. Today, there are good tasting, low-fat, low-cholesterol substitutes for most of the things you like to eat. Choose low-fat or nonfat alternatives. Choose round or loin cuts of red meat. These types of cuts are lowest in fat and cholesterol. Chicken (without the skin), fish, veal, and ground turkey breast are great choices. Eliminate fatty meats, such as hot dogs and salami. Even shellfish have little or no saturated fat. Have a 3 oz (85 g) portion when you eat lean meat, poultry, or fish. Trans fats are also called "partially hydrogenated oils." They are oils that have been scientifically manipulated so that they are solid at room temperature resulting in a longer shelf life and improved taste and texture of foods in which they are added. Trans fats are found in stick margarine, some tub margarines, cookies, crackers, and baked goods.  When baking and cooking, oils are a great substitute for butter. The monounsaturated oils are especially beneficial since it is believed they lower LDL and raise HDL. The oils you should avoid entirely are saturated tropical oils, such as coconut and palm.  Remember to eat a lot from food groups that are naturally free of saturated and trans fat, including fish, fruit, vegetables, beans, grains (barley, rice, couscous, bulgur wheat), and pasta (without cream sauces).  IDENTIFYING FOODS THAT LOWER FAT AND CHOLESTEROL  Soluble fiber may lower your cholesterol. This type of fiber is found in fruits such as apples, vegetables such as broccoli, potatoes, and carrots,   legumes such as beans, peas, and lentils, and grains such as barley. Foods fortified with plant sterols (phytosterol) may also lower cholesterol. You should eat at least 2 g  per day of these foods for a cholesterol lowering effect.  Read package labels to identify low-saturated fats, trans fat free, and low-fat foods at the supermarket. Select cheeses that have only 2 to 3 g saturated fat per ounce. Use a heart-healthy tub margarine that is free of trans fats or partially hydrogenated oil. When buying baked goods (cookies, crackers), avoid partially hydrogenated oils. Breads and muffins should be made from whole grains (whole-wheat or whole oat flour, instead of "flour" or "enriched flour"). Buy non-creamy canned soups with reduced salt and no added fats.  FOOD PREPARATION TECHNIQUES  Never deep-fry. If you must fry, either stir-fry, which uses very little fat, or use non-stick cooking sprays. When possible, broil, bake, or roast meats, and steam vegetables. Instead of putting butter or margarine on vegetables, use lemon and herbs, applesauce, and cinnamon (for squash and sweet potatoes). Use nonfat yogurt, salsa, and low-fat dressings for salads.  LOW-SATURATED FAT / LOW-FAT FOOD SUBSTITUTES Meats / Saturated Fat (g)  Avoid: Steak, marbled (3 oz/85 g) / 11 g  Choose: Steak, lean (3 oz/85 g) / 4 g  Avoid: Hamburger (3 oz/85 g) / 7 g  Choose: Hamburger, lean (3 oz/85 g) / 5 g  Avoid: Ham (3 oz/85 g) / 6 g  Choose: Ham, lean cut (3 oz/85 g) / 2.4 g  Avoid: Chicken, with skin, dark meat (3 oz/85 g) / 4 g  Choose: Chicken, skin removed, dark meat (3 oz/85 g) / 2 g  Avoid: Chicken, with skin, light meat (3 oz/85 g) / 2.5 g  Choose: Chicken, skin removed, light meat (3 oz/85 g) / 1 g Dairy / Saturated Fat (g)  Avoid: Whole milk (1 cup) / 5 g  Choose: Low-fat milk, 2% (1 cup) / 3 g  Choose: Low-fat milk, 1% (1 cup) / 1.5 g  Choose: Skim milk (1 cup) / 0.3 g  Avoid: Hard cheese (1 oz/28 g) / 6 g  Choose: Skim milk cheese (1 oz/28 g) / 2 to 3 g  Avoid: Cottage cheese, 4% fat (1 cup) / 6.5 g  Choose: Low-fat cottage cheese, 1% fat (1 cup) / 1.5  g  Avoid: Ice cream (1 cup) / 9 g  Choose: Sherbet (1 cup) / 2.5 g  Choose: Nonfat frozen yogurt (1 cup) / 0.3 g  Choose: Frozen fruit bar / trace  Avoid: Whipped cream (1 tbs) / 3.5 g  Choose: Nondairy whipped topping (1 tbs) / 1 g Condiments / Saturated Fat (g)  Avoid: Mayonnaise (1 tbs) / 2 g  Choose: Low-fat mayonnaise (1 tbs) / 1 g  Avoid: Butter (1 tbs) / 7 g  Choose: Extra light margarine (1 tbs) / 1 g  Avoid: Coconut oil (1 tbs) / 11.8 g  Choose: Olive oil (1 tbs) / 1.8 g  Choose: Corn oil (1 tbs) / 1.7 g  Choose: Safflower oil (1 tbs) / 1.2 g  Choose: Sunflower oil (1 tbs) / 1.4 g  Choose: Soybean oil (1 tbs) / 2.4 g  Choose: Canola oil (1 tbs) / 1 g Document Released: 05/14/2005 Document Revised: 09/08/2012 Document Reviewed: 11/02/2010 Exit[employer] Patient Information 2015 [employer]. This information is not intended to replace advice given to you by your health care provider. Make sure you discuss any questions you have with your health care   provider.  

## 2013-12-09 NOTE — Progress Notes (Signed)
MRN: [id number] Name: Daniel Adams  Sex: male Age: 53 y.o. DOB: 1960-10-28  Allergies: Wellbutrin  Chief Complaint  Patient presents with  . Follow-up    HPI: Patient is 53 y.o. male who has to of hypertension hyperlipidemia comes today for followup , requesting refill on his medication today's blood pressure is borderline elevated denies any acute symptoms, has history of chronic low back pain patient has been seen a pain management he is requesting another referral, denies any worsening pain as per patient is taking Percocet and lyrica.  Past Medical History  Diagnosis Date  . Hypertension   . Arthritis   . Depression   . GERD (gastroesophageal reflux disease)   . Seizure disorder   . H. pylori infection     DX blood test     Past Surgical History  Procedure Laterality Date  . Knee arthroscopy        Medication List       This list is accurate as of: 12/09/13  3:30 PM.  Always use your most recent med list.               amLODipine 10 MG tablet  Commonly known as:  NORVASC  Take 1 tablet (10 mg total) by mouth daily.     amoxicillin 500 MG capsule  Commonly known as:  AMOXIL  Take 1 capsule (500 mg total) by mouth 3 (three) times daily.     atorvastatin 20 MG tablet  Commonly known as:  LIPITOR  Take 1 tablet (20 mg total) by mouth daily.     cyclobenzaprine 10 MG tablet  Commonly known as:  FLEXERIL  Take 1 tablet (10 mg total) by mouth at bedtime.     RABEprazole 20 MG tablet  Commonly known as:  ACIPHEX  Take 20 mg by mouth daily.     traMADol 50 MG tablet  Commonly known as:  ULTRAM  Take 1 tablet (50 mg total) by mouth every 6 (six) hours as needed.        Meds ordered this encounter  Medications  . atorvastatin (LIPITOR) 20 MG tablet    Sig: Take 1 tablet (20 mg total) by mouth daily.    Dispense:  30 tablet    Refill:  5     There is no immunization history on file for this patient.  Family History  Problem Relation Age of  Onset  . Colon cancer Paternal Aunt     History  Substance Use Topics  . Smoking status: Current Every Day Smoker    Types: Cigarettes  . Smokeless tobacco: Never Used  . Alcohol Use: No     Comment: Former    Review of Systems   As noted in HPI  Filed Vitals:   12/09/13 1456  BP: 141/95  Pulse: 72  Temp: 98.2 F (36.8 C)  Resp: 16    Physical Exam  Physical Exam  Constitutional: No distress.  Eyes: EOM are normal. Pupils are equal, round, and reactive to light.  Cardiovascular: Normal rate and regular rhythm.   Pulmonary/Chest: Breath sounds normal. No respiratory distress. He has no wheezes. He has no rales.  Musculoskeletal: He exhibits no edema.  Some lower lumbar spinal and paraspinal tenderness     CBC    Component Value Date/Time   WBC 10.3 10/29/2008 1548   RBC 4.59 10/29/2008 1548   HGB 13.9 10/07/2010 1704   HCT 41.0 10/07/2010 1704   PLT 378 10/29/2008 1548  MCV 88.7 10/29/2008 1548   LYMPHSABS 1.9 10/29/2008 1548   MONOABS 0.5 10/29/2008 1548   EOSABS 0.1 10/29/2008 1548   BASOSABS 0.1 10/29/2008 1548    CMP     Component Value Date/Time   NA 139 05/07/2013 1201   K 3.8 05/07/2013 1201   CL 102 05/07/2013 1201   CO2 29 05/07/2013 1201   GLUCOSE 91 05/07/2013 1201   BUN 6 05/07/2013 1201   CREATININE 0.88 05/07/2013 1201   CREATININE 0.80 10/07/2010 1704   CALCIUM 9.5 05/07/2013 1201   PROT 6.8 05/07/2013 1201   ALBUMIN 4.6 05/07/2013 1201   AST 13 05/07/2013 1201   ALT 18 05/07/2013 1201   ALKPHOS 71 05/07/2013 1201   BILITOT 0.5 05/07/2013 1201   GFRNONAA >89 05/07/2013 1201   GFRNONAA >60 10/29/2008 1548   GFRAA >89 05/07/2013 1201   GFRAA  Value: >60        The eGFR has been calculated using the MDRD equation. This calculation has not been validated in all clinical situations. eGFR's persistently <60 mL/min signify possible Chronic Kidney Disease. 10/29/2008 1548    Lab Results  Component Value Date/Time   CHOL 236* 05/07/2013 12:01 PM    No  components found with this basename: hga1c    Lab Results  Component Value Date/Time   AST 13 05/07/2013 12:01 PM    Assessment and Plan  Essential hypertension - Plan: Pressure is borderline elevated patient is on amlodipine 10 mg, I have advised patient for DASH diet will recheck blood chemistry COMPLETE METABOLIC PANEL WITH GFR  Other and unspecified hyperlipidemia - Plan: Patient is on atorvastatin (LIPITOR) 20 MG tablet, will repeat Lipid panel  Chronic back pain - Plan: Ambulatory referral to Pain Clinic, Vit D  25 hydroxy (rtn osteoporosis monitoring)   Health Maintenance -Colonoscopy: as per patient he already had colonoscopy done 5 to 6 years ago and reported to be normal    Return in about 3 months (around 03/11/2014) for hypertension.  Lorayne Marek, MD

## 2013-12-10 ENCOUNTER — Telehealth: Payer: Self-pay

## 2013-12-10 LAB — VITAMIN D 25 HYDROXY (VIT D DEFICIENCY, FRACTURES): Vit D, 25-Hydroxy: 13 ng/mL — ABNORMAL LOW (ref 30–89)

## 2013-12-10 MED ORDER — VITAMIN D (ERGOCALCIFEROL) 1.25 MG (50000 UNIT) PO CAPS: 50000.0000 [IU] | ORAL_CAPSULE | ORAL | Status: DC

## 2013-12-10 NOTE — Telephone Encounter (Signed)
Message copied by Lestine MountJUAREZ, Lorenzo Arscott L on Thu Dec 10, 2013 10:13 AM ------      Message from: Doris CheadleADVANI, DEEPAK      Created: Thu Dec 10, 2013 10:11 AM       Blood work reviewed, noticed low vitamin D, call patient advise to start ergocalciferol 50,000 units once a week for the duration of  12 weeks.       Also his cholesterol (LDL) is still elevated, advise patient to take Lipitor regularly also advise for low fat diet. ------

## 2013-12-16 ENCOUNTER — Other Ambulatory Visit: Payer: Self-pay | Admitting: Emergency Medicine

## 2013-12-16 DIAGNOSIS — R94131 Abnormal electromyogram [EMG]: Secondary | ICD-10-CM

## 2014-04-13 ENCOUNTER — Ambulatory Visit: Payer: Medicaid Other | Attending: Internal Medicine | Admitting: Internal Medicine

## 2014-04-13 ENCOUNTER — Encounter: Payer: Self-pay | Admitting: Internal Medicine

## 2014-04-13 VITALS — BP 150/80 | HR 77 | Temp 98.0°F | Resp 16 | Wt 147.0 lb

## 2014-04-13 DIAGNOSIS — N529 Male erectile dysfunction, unspecified: Secondary | ICD-10-CM | POA: Insufficient documentation

## 2014-04-13 DIAGNOSIS — F17210 Nicotine dependence, cigarettes, uncomplicated: Secondary | ICD-10-CM | POA: Insufficient documentation

## 2014-04-13 DIAGNOSIS — K219 Gastro-esophageal reflux disease without esophagitis: Secondary | ICD-10-CM | POA: Insufficient documentation

## 2014-04-13 DIAGNOSIS — K5900 Constipation, unspecified: Secondary | ICD-10-CM | POA: Insufficient documentation

## 2014-04-13 DIAGNOSIS — E785 Hyperlipidemia, unspecified: Secondary | ICD-10-CM | POA: Insufficient documentation

## 2014-04-13 DIAGNOSIS — IMO0001 Reserved for inherently not codable concepts without codable children: Secondary | ICD-10-CM

## 2014-04-13 DIAGNOSIS — I10 Essential (primary) hypertension: Secondary | ICD-10-CM | POA: Diagnosis present

## 2014-04-13 DIAGNOSIS — K649 Unspecified hemorrhoids: Secondary | ICD-10-CM | POA: Diagnosis not present

## 2014-04-13 DIAGNOSIS — M545 Low back pain: Secondary | ICD-10-CM | POA: Diagnosis not present

## 2014-04-13 DIAGNOSIS — R030 Elevated blood-pressure reading, without diagnosis of hypertension: Secondary | ICD-10-CM

## 2014-04-13 DIAGNOSIS — Z79899 Other long term (current) drug therapy: Secondary | ICD-10-CM | POA: Insufficient documentation

## 2014-04-13 MED ORDER — MAGNESIUM HYDROXIDE 400 MG/5ML PO SUSP: 5.0000 mL | Freq: Every day | ORAL | Status: DC | PRN

## 2014-04-13 MED ORDER — HYDROCORTISONE ACETATE 25 MG RE SUPP: 25.0000 mg | Freq: Two times a day (BID) | RECTAL | Status: DC

## 2014-04-13 MED ORDER — SILDENAFIL CITRATE 25 MG PO TABS: 25.0000 mg | ORAL_TABLET | Freq: Every day | ORAL | Status: DC | PRN

## 2014-04-13 MED ORDER — CLONIDINE HCL 0.1 MG PO TABS
0.1000 mg | ORAL_TABLET | Freq: Once | ORAL | Status: AC
  Administered 2014-04-13: 0.1 mg via ORAL

## 2014-04-13 NOTE — Patient Instructions (Addendum)
DASH Eating Plan DASH stands for "Dietary Approaches to Stop Hypertension." The DASH eating plan is a healthy eating plan that has been shown to reduce high blood pressure (hypertension). Additional health benefits may include reducing the risk of type 2 diabetes mellitus, heart disease, and stroke. The DASH eating plan may also help with weight loss. WHAT DO I NEED TO KNOW ABOUT THE DASH EATING PLAN? For the DASH eating plan, you will follow these general guidelines:  Choose foods with a percent daily value for sodium of less than 5% (as listed on the food label).  Use salt-free seasonings or herbs instead of table salt or sea salt.  Check with your health care provider or pharmacist before using salt substitutes.  Eat lower-sodium products, often labeled as "lower sodium" or "no salt added."  Eat fresh foods.  Eat more vegetables, fruits, and low-fat dairy products.  Choose whole grains. Look for the word "whole" as the first word in the ingredient list.  Choose fish and skinless chicken or turkey more often than red meat. Limit fish, poultry, and meat to 6 oz (170 g) each day.  Limit sweets, desserts, sugars, and sugary drinks.  Choose heart-healthy fats.  Limit cheese to 1 oz (28 g) per day.  Eat more home-cooked food and less restaurant, buffet, and fast food.  Limit fried foods.  Cook foods using methods other than frying.  Limit canned vegetables. If you do use them, rinse them well to decrease the sodium.  When eating at a restaurant, ask that your food be prepared with less salt, or no salt if possible. WHAT FOODS CAN I EAT? Seek help from a dietitian for individual calorie needs. Grains Whole grain or whole wheat bread. Brown rice. Whole grain or whole wheat pasta. Quinoa, bulgur, and whole grain cereals. Low-sodium cereals. Corn or whole wheat flour tortillas. Whole grain cornbread. Whole grain crackers. Low-sodium crackers. Vegetables Fresh or frozen vegetables  (raw, steamed, roasted, or grilled). Low-sodium or reduced-sodium tomato and vegetable juices. Low-sodium or reduced-sodium tomato sauce and paste. Low-sodium or reduced-sodium canned vegetables.  Fruits All fresh, canned (in natural juice), or frozen fruits. Meat and Other Protein Products Ground beef (85% or leaner), grass-fed beef, or beef trimmed of fat. Skinless chicken or turkey. Ground chicken or turkey. Pork trimmed of fat. All fish and seafood. Eggs. Dried beans, peas, or lentils. Unsalted nuts and seeds. Unsalted canned beans. Dairy Low-fat dairy products, such as skim or 1% milk, 2% or reduced-fat cheeses, low-fat ricotta or cottage cheese, or plain low-fat yogurt. Low-sodium or reduced-sodium cheeses. Fats and Oils Tub margarines without trans fats. Light or reduced-fat mayonnaise and salad dressings (reduced sodium). Avocado. Safflower, olive, or canola oils. Natural peanut or almond butter. Other Unsalted popcorn and pretzels. The items listed above may not be a complete list of recommended foods or beverages. Contact your dietitian for more options. WHAT FOODS ARE NOT RECOMMENDED? Grains White bread. White pasta. White rice. Refined cornbread. Bagels and croissants. Crackers that contain trans fat. Vegetables Creamed or fried vegetables. Vegetables in a cheese sauce. Regular canned vegetables. Regular canned tomato sauce and paste. Regular tomato and vegetable juices. Fruits Dried fruits. Canned fruit in light or heavy syrup. Fruit juice. Meat and Other Protein Products Fatty cuts of meat. Ribs, chicken wings, bacon, sausage, bologna, salami, chitterlings, fatback, hot dogs, bratwurst, and packaged luncheon meats. Salted nuts and seeds. Canned beans with salt. Dairy Whole or 2% milk, cream, half-and-half, and cream cheese. Whole-fat or sweetened yogurt. Full-fat   cheeses or blue cheese. Nondairy creamers and whipped toppings. Processed cheese, cheese spreads, or cheese  curds. Condiments Onion and garlic salt, seasoned salt, table salt, and sea salt. Canned and packaged gravies. Worcestershire sauce. Tartar sauce. Barbecue sauce. Teriyaki sauce. Soy sauce, including reduced sodium. Steak sauce. Fish sauce. Oyster sauce. Cocktail sauce. Horseradish. Ketchup and mustard. Meat flavorings and tenderizers. Bouillon cubes. Hot sauce. Tabasco sauce. Marinades. Taco seasonings. Relishes. Fats and Oils Butter, stick margarine, lard, shortening, ghee, and bacon fat. Coconut, palm kernel, or palm oils. Regular salad dressings. Other Pickles and olives. Salted popcorn and pretzels. The items listed above may not be a complete list of foods and beverages to avoid. Contact your dietitian for more information. WHERE CAN I FIND MORE INFORMATION? National Heart, Lung, and Blood Institute: www.nhlbi.nih.gov/health/health-topics/topics/dash/ Document Released: 05/03/2011 Document Revised: 09/28/2013 Document Reviewed: 03/18/2013 [employer] Patient Information 2015 [employer]. This information is not intended to replace advice given to you by your health care provider. Make sure you discuss any questions you have with your health care provider. High-Fiber Diet Fiber is found in fruits, vegetables, and grains. A high-fiber diet encourages the addition of more whole grains, legumes, fruits, and vegetables in your diet. The recommended amount of fiber for adult males is 38 g per day. For adult females, it is 25 g per day. Pregnant and lactating women should get 28 g of fiber per day. If you have a digestive or bowel problem, ask your caregiver for advice before adding high-fiber foods to your diet. Eat a variety of high-fiber foods instead of only a select few type of foods.  PURPOSE  To increase stool bulk.  To make bowel movements more regular to prevent constipation.  To lower cholesterol.  To prevent overeating. WHEN IS THIS DIET USED?  It may be used if you have  constipation and hemorrhoids.  It may be used if you have uncomplicated diverticulosis (intestine condition) and irritable bowel syndrome.  It may be used if you need help with weight management.  It may be used if you want to add it to your diet as a protective measure against atherosclerosis, diabetes, and cancer. SOURCES OF FIBER  Whole-grain breads and cereals.  Fruits, such as apples, oranges, bananas, berries, prunes, and pears.  Vegetables, such as green peas, carrots, sweet potatoes, beets, broccoli, cabbage, spinach, and artichokes.  Legumes, such split peas, soy, lentils.  Almonds. FIBER CONTENT IN FOODS Starches and Grains / Dietary Fiber (g)  Cheerios, 1 cup / 3 g  Corn Flakes cereal, 1 cup / 0.7 g  Rice crispy treat cereal, 1 cup / 0.3 g  Instant oatmeal (cooked),  cup / 2 g  Frosted wheat cereal, 1 cup / 5.1 g  Brown, long-grain rice (cooked), 1 cup / 3.5 g  White, long-grain rice (cooked), 1 cup / 0.6 g  Enriched macaroni (cooked), 1 cup / 2.5 g Legumes / Dietary Fiber (g)  Baked beans (canned, plain, or vegetarian),  cup / 5.2 g  Kidney beans (canned),  cup / 6.8 g  Pinto beans (cooked),  cup / 5.5 g Breads and Crackers / Dietary Fiber (g)  Plain or honey graham crackers, 2 squares / 0.7 g  Saltine crackers, 3 squares / 0.3 g  Plain, salted pretzels, 10 pieces / 1.8 g  Whole-wheat bread, 1 slice / 1.9 g  White bread, 1 slice / 0.7 g  Raisin bread, 1 slice / 1.2 g  Plain bagel, 3 oz / 2 g  Flour   tortilla, 1 oz / 0.9 g  Corn tortilla, 1 small / 1.5 g  Hamburger or hotdog bun, 1 small / 0.9 g Fruits / Dietary Fiber (g)  Apple with skin, 1 medium / 4.4 g  Sweetened applesauce,  cup / 1.5 g  Banana,  medium / 1.5 g  Grapes, 10 grapes / 0.4 g  Orange, 1 small / 2.3 g  Raisin, 1.5 oz / 1.6 g  Melon, 1 cup / 1.4 g Vegetables / Dietary Fiber (g)  Green beans (canned),  cup / 1.3 g  Carrots (cooked),  cup / 2.3  g  Broccoli (cooked),  cup / 2.8 g  Peas (cooked),  cup / 4.4 g  Mashed potatoes,  cup / 1.6 g  Lettuce, 1 cup / 0.5 g  Corn (canned),  cup / 1.6 g  Tomato,  cup / 1.1 g Document Released: 05/14/2005 Document Revised: 11/13/2011 Document Reviewed: 08/16/2011 [employer] Patient Information 2015 [employer]. This information is not intended to replace advice given to you by your health care provider. Make sure you discuss any questions you have with your health care provider.  

## 2014-04-13 NOTE — Progress Notes (Signed)
MRN: [id number] Name: Daniel Adams  Sex: male Age: 53 y.o. DOB: [birth year]  Allergies: Wellbutrin  Chief Complaint  Patient presents with  . Rectal Bleeding    HPI: Patient is 53 y.o. male who Has history of hypertension hyperlipidemia chronic lower back pain comes today for followup her as per patient he just took his blood pressure medication before he can his blood pressure initially was elevated, he was given clonidine and his repeat manual blood pressure is 150/80, denies any headache dizziness chest pain or shortness of breath, patient does complain of bleeding per rectum, patient tends to be more constipated as he has been taking narcotic medication for back pain, patient is also requesting some medication to help him with erectile dysfunction.  Past Medical History  Diagnosis Date  . Hypertension   . Arthritis   . Depression   . GERD (gastroesophageal reflux disease)   . Seizure disorder   . H. pylori infection     DX blood test     Past Surgical History  Procedure Laterality Date  . Knee arthroscopy        Medication List       This list is accurate as of: 04/13/14  5:43 PM.  Always use your most recent med list.               amLODipine 10 MG tablet  Commonly known as:  NORVASC  Take 1 tablet (10 mg total) by mouth daily.     amoxicillin 500 MG capsule  Commonly known as:  AMOXIL  Take 1 capsule (500 mg total) by mouth 3 (three) times daily.     atorvastatin 20 MG tablet  Commonly known as:  LIPITOR  Take 1 tablet (20 mg total) by mouth daily.     cyclobenzaprine 10 MG tablet  Commonly known as:  FLEXERIL  Take 1 tablet (10 mg total) by mouth at bedtime.     hydrocortisone 25 MG suppository  Commonly known as:  ANUSOL-HC  Place 1 suppository (25 mg total) rectally 2 (two) times daily.     magnesium hydroxide 400 MG/5ML suspension  Commonly known as:  MILK OF MAGNESIA  Take 5 mLs by mouth daily as needed for mild constipation.     RABEprazole 20 MG tablet  Commonly known as:  ACIPHEX  Take 20 mg by mouth daily.     sildenafil 25 MG tablet  Commonly known as:  VIAGRA  Take 1 tablet (25 mg total) by mouth daily as needed for erectile dysfunction.     traMADol 50 MG tablet  Commonly known as:  ULTRAM  Take 1 tablet (50 mg total) by mouth every 6 (six) hours as needed.     Vitamin D (Ergocalciferol) 50000 UNITS Caps capsule  Commonly known as:  DRISDOL  Take 1 capsule (50,000 Units total) by mouth every 7 (seven) days.        Meds ordered this encounter  Medications  . cloNIDine (CATAPRES) tablet 0.1 mg    Sig:   . magnesium hydroxide (MILK OF MAGNESIA) 400 MG/5ML suspension    Sig: Take 5 mLs by mouth daily as needed for mild constipation.    Dispense:  360 mL    Refill:  0  . DISCONTD: sildenafil (VIAGRA) 25 MG tablet    Sig: Take 1 tablet (25 mg total) by mouth daily as needed for erectile dysfunction.    Dispense:  10 tablet    Refill:  1  . hydrocortisone (ANUSOL-HC)  25 MG suppository    Sig: Place 1 suppository (25 mg total) rectally 2 (two) times daily.    Dispense:  12 suppository    Refill:  0  . sildenafil (VIAGRA) 25 MG tablet    Sig: Take 1 tablet (25 mg total) by mouth daily as needed for erectile dysfunction.    Dispense:  10 tablet    Refill:  1     There is no immunization history on file for this patient.  Family History  Problem Relation Age of Onset  . Colon cancer Paternal Aunt     History  Substance Use Topics  . Smoking status: Current Every Day Smoker    Types: Cigarettes  . Smokeless tobacco: Never Used  . Alcohol Use: No     Comment: Former    Review of Systems   As noted in HPI  Filed Vitals:   04/13/14 1727  BP: 150/80  Pulse:   Temp:   Resp:     Physical Exam  Physical Exam  Constitutional: No distress.  Eyes: EOM are normal. Pupils are equal, round, and reactive to light.  Cardiovascular: Normal rate and regular rhythm.   Pulmonary/Chest:  Breath sounds normal. No respiratory distress. He has no wheezes. He has no rales.  Abdominal: Soft. There is no tenderness. There is no rebound.  Musculoskeletal: He exhibits no edema.    CBC    Component Value Date/Time   WBC 10.3 10/29/2008 1548   RBC 4.59 10/29/2008 1548   HGB 13.9 10/07/2010 1704   HCT 41.0 10/07/2010 1704   PLT 378 10/29/2008 1548   MCV 88.7 10/29/2008 1548   LYMPHSABS 1.9 10/29/2008 1548   MONOABS 0.5 10/29/2008 1548   EOSABS 0.1 10/29/2008 1548   BASOSABS 0.1 10/29/2008 1548    CMP     Component Value Date/Time   NA 136 12/09/2013 1523   K 4.4 12/09/2013 1523   CL 101 12/09/2013 1523   CO2 27 12/09/2013 1523   GLUCOSE 98 12/09/2013 1523   BUN 11 12/09/2013 1523   CREATININE 0.80 12/09/2013 1523   CREATININE 0.80 10/07/2010 1704   CALCIUM 9.0 12/09/2013 1523   PROT 6.5 12/09/2013 1523   ALBUMIN 4.5 12/09/2013 1523   AST 14 12/09/2013 1523   ALT 21 12/09/2013 1523   ALKPHOS 74 12/09/2013 1523   BILITOT 0.4 12/09/2013 1523   GFRNONAA >89 12/09/2013 1523   GFRNONAA >60 10/29/2008 1548   GFRAA >89 12/09/2013 1523   GFRAA  10/29/2008 1548    >60        The eGFR has been calculated using the MDRD equation. This calculation has not been validated in all clinical situations. eGFR's persistently <60 mL/min signify possible Chronic Kidney Disease.    Lab Results  Component Value Date/Time   CHOL 220* 12/09/2013 03:23 PM    No components found for: HGA1C  Lab Results  Component Value Date/Time   AST 14 12/09/2013 03:23 PM    Assessment and Plan  Elevated blood pressure - Plan: cloNIDine (CATAPRES) tablet 0.1 mg, COMPLETE METABOLIC PANEL WITH GFR  Essential hypertension - Plan: advised patient for DASH diet, he will continue with Norvasc 10 mg daily, will check blood chemistry COMPLETE METABOLIC PANEL WITH GFR  Constipation, unspecified constipation type - Plan:advised patient for high fiber diet, trial of magnesium hydroxide (MILK OF  MAGNESIA) 400 MG/5ML suspension  Hemorrhoids, unspecified hemorrhoid type - Plan: hydrocortisone (ANUSOL-HC) 25 MG suppository  Erectile dysfunction, unspecified erectile dysfunction type - Plan:  prescribedsildenafil (VIAGRA) 25 MG tablet, advise patient not to take this medication along with blood pressure medication because it can drop his blood pressure too low, patient understand and verbalized instructions.  Hyperlipidemia - Plan:patient is currently on Lipitor 20 mg, will check fasting Lipid panel   Health Maintenance Patient declines flu shot   Return in about 3 months (around 07/14/2014) for hypertension.  Lorayne Marek, MD

## 2014-04-13 NOTE — Progress Notes (Signed)
Patient states he has been having some rectal bleeding [place] was [place] some problems with constipation Patient also requesting medication for erectile dis function

## 2014-04-15 ENCOUNTER — Ambulatory Visit: Payer: Medicaid Other | Attending: Internal Medicine

## 2014-04-15 DIAGNOSIS — R030 Elevated blood-pressure reading, without diagnosis of hypertension: Principal | ICD-10-CM

## 2014-04-15 DIAGNOSIS — IMO0001 Reserved for inherently not codable concepts without codable children: Secondary | ICD-10-CM

## 2014-04-15 DIAGNOSIS — E785 Hyperlipidemia, unspecified: Secondary | ICD-10-CM

## 2014-04-15 LAB — COMPLETE METABOLIC PANEL WITH GFR
ALBUMIN: 3.7 g/dL (ref 3.5–5.2)
ALT: 12 U/L (ref 0–53)
AST: 12 U/L (ref 0–37)
Alkaline Phosphatase: 54 U/L (ref 39–117)
BILIRUBIN TOTAL: 0.3 mg/dL (ref 0.2–1.2)
BUN: 6 mg/dL (ref 6–23)
CHLORIDE: 105 meq/L (ref 96–112)
CO2: 26 meq/L (ref 19–32)
Calcium: 8.7 mg/dL (ref 8.4–10.5)
Creat: 0.81 mg/dL (ref 0.50–1.35)
GFR, Est African American: >89 mL/min
GFR, Est Non African American: >89 mL/min
GLUCOSE: 98 mg/dL (ref 70–99)
POTASSIUM: 4.1 meq/L (ref 3.5–5.3)
Sodium: 141 mEq/L (ref 135–145)
Total Protein: 5.5 g/dL — ABNORMAL LOW (ref 6.0–8.3)

## 2014-04-15 LAB — LIPID PANEL
CHOLESTEROL: 159 mg/dL (ref 0–200)
HDL: 38 mg/dL — ABNORMAL LOW (ref >39)
LDL CALC: 92 mg/dL (ref 0–99)
Total CHOL/HDL Ratio: 4.2 Ratio
Triglycerides: 145 mg/dL (ref <150)
VLDL: 29 mg/dL (ref 0–40)

## 2014-04-21 ENCOUNTER — Telehealth: Payer: Self-pay | Admitting: *Deleted

## 2014-04-21 NOTE — Telephone Encounter (Signed)
-----   Message from Doris Cheadleeepak Advani, MD sent at 04/16/2014 10:56 AM EST ----- Call and Let the patient know that his blood work shows improvement in cholesterol level.

## 2014-05-04 ENCOUNTER — Telehealth: Payer: Self-pay | Admitting: Internal Medicine

## 2014-05-04 NOTE — Telephone Encounter (Signed)
Pt. Called to request the results of blood work. Please f/u with pt.

## 2014-05-05 ENCOUNTER — Telehealth: Payer: Self-pay | Admitting: Internal Medicine

## 2014-05-05 NOTE — Telephone Encounter (Signed)
Pt's wife came into facility to request blood work results. Please f/u with pt.

## 2014-05-06 ENCOUNTER — Telehealth: Payer: Self-pay | Admitting: Emergency Medicine

## 2014-05-06 NOTE — Telephone Encounter (Signed)
Left message with improved cholesterol levels

## 2014-05-18 ENCOUNTER — Telehealth: Payer: Self-pay | Admitting: Internal Medicine

## 2014-05-18 NOTE — Telephone Encounter (Signed)
Pt's wife calling to report that constipation has been alleviated but pt is still having periodic rectal bleeding and is requesting a referral to specialist for bleed. Please f/u with pt, last office visit was on 04/13/14.

## 2014-05-19 ENCOUNTER — Other Ambulatory Visit: Payer: Self-pay | Admitting: Emergency Medicine

## 2014-05-19 ENCOUNTER — Telehealth: Payer: Self-pay | Admitting: Emergency Medicine

## 2014-05-19 DIAGNOSIS — K625 Hemorrhage of anus and rectum: Secondary | ICD-10-CM

## 2014-05-19 NOTE — Telephone Encounter (Signed)
Patient can be given  GI referral

## 2014-05-19 NOTE — Telephone Encounter (Signed)
After speaking with pt states he is continues to have rectal bleeding after wiping. Pt states he is using Anusol cream as needed. Denies heavy bright red blood or dizziness. Instructed pt he will need to schedule f/u oV with provider. Pt is requesting GI referral.  Please f/u

## 2014-06-02 ENCOUNTER — Ambulatory Visit: Payer: Medicaid Other | Admitting: Internal Medicine

## 2014-08-30 ENCOUNTER — Ambulatory Visit: Payer: Medicaid Other | Attending: Internal Medicine | Admitting: Internal Medicine

## 2014-08-30 ENCOUNTER — Encounter: Payer: Self-pay | Admitting: Internal Medicine

## 2014-08-30 VITALS — BP 165/97 | HR 86 | Temp 98.0°F | Resp 16 | Wt 150.0 lb

## 2014-08-30 DIAGNOSIS — F17210 Nicotine dependence, cigarettes, uncomplicated: Secondary | ICD-10-CM | POA: Insufficient documentation

## 2014-08-30 DIAGNOSIS — Z79899 Other long term (current) drug therapy: Secondary | ICD-10-CM | POA: Diagnosis not present

## 2014-08-30 DIAGNOSIS — M549 Dorsalgia, unspecified: Secondary | ICD-10-CM | POA: Diagnosis not present

## 2014-08-30 DIAGNOSIS — R911 Solitary pulmonary nodule: Secondary | ICD-10-CM | POA: Diagnosis not present

## 2014-08-30 DIAGNOSIS — R200 Anesthesia of skin: Secondary | ICD-10-CM

## 2014-08-30 DIAGNOSIS — M4808 Spinal stenosis, sacral and sacrococcygeal region: Secondary | ICD-10-CM | POA: Diagnosis not present

## 2014-08-30 DIAGNOSIS — Z79891 Long term (current) use of opiate analgesic: Secondary | ICD-10-CM | POA: Diagnosis not present

## 2014-08-30 DIAGNOSIS — K219 Gastro-esophageal reflux disease without esophagitis: Secondary | ICD-10-CM | POA: Insufficient documentation

## 2014-08-30 DIAGNOSIS — I10 Essential (primary) hypertension: Secondary | ICD-10-CM

## 2014-08-30 DIAGNOSIS — R202 Paresthesia of skin: Secondary | ICD-10-CM

## 2014-08-30 DIAGNOSIS — Z792 Long term (current) use of antibiotics: Secondary | ICD-10-CM | POA: Insufficient documentation

## 2014-08-30 DIAGNOSIS — E785 Hyperlipidemia, unspecified: Secondary | ICD-10-CM | POA: Insufficient documentation

## 2014-08-30 DIAGNOSIS — G8929 Other chronic pain: Secondary | ICD-10-CM

## 2014-08-30 LAB — CBC WITH DIFFERENTIAL/PLATELET
Basophils Absolute: 0 10*3/uL (ref 0.0–0.1)
Basophils Relative: 0 % (ref 0–1)
EOS ABS: 0.1 10*3/uL (ref 0.0–0.7)
Eosinophils Relative: 1 % (ref 0–5)
HEMATOCRIT: 42.2 % (ref 39.0–52.0)
HEMOGLOBIN: 14.3 g/dL (ref 13.0–17.0)
LYMPHS ABS: 2.4 10*3/uL (ref 0.7–4.0)
LYMPHS PCT: 27 % (ref 12–46)
MCH: 28.9 pg (ref 26.0–34.0)
MCHC: 33.9 g/dL (ref 30.0–36.0)
MCV: 85.3 fL (ref 78.0–100.0)
MONOS PCT: 5 % (ref 3–12)
MPV: 9.5 fL (ref 8.6–12.4)
Monocytes Absolute: 0.5 10*3/uL (ref 0.1–1.0)
NEUTROS PCT: 67 % (ref 43–77)
Neutro Abs: 6 10*3/uL (ref 1.7–7.7)
Platelets: 358 10*3/uL (ref 150–400)
RBC: 4.95 MIL/uL (ref 4.22–5.81)
RDW: 14.1 % (ref 11.5–15.5)
WBC: 9 10*3/uL (ref 4.0–10.5)

## 2014-08-30 LAB — COMPLETE METABOLIC PANEL WITH GFR
ALBUMIN: 4.2 g/dL (ref 3.5–5.2)
ALT: 9 U/L (ref 0–53)
AST: 12 U/L (ref 0–37)
Alkaline Phosphatase: 78 U/L (ref 39–117)
BILIRUBIN TOTAL: 0.4 mg/dL (ref 0.2–1.2)
BUN: 10 mg/dL (ref 6–23)
CALCIUM: 9.2 mg/dL (ref 8.4–10.5)
CHLORIDE: 102 meq/L (ref 96–112)
CO2: 26 meq/L (ref 19–32)
Creat: 0.89 mg/dL (ref 0.50–1.35)
GLUCOSE: 96 mg/dL (ref 70–99)
Potassium: 4.9 mEq/L (ref 3.5–5.3)
SODIUM: 138 meq/L (ref 135–145)
TOTAL PROTEIN: 6.7 g/dL (ref 6.0–8.3)

## 2014-08-30 MED ORDER — AMLODIPINE BESYLATE 10 MG PO TABS: 10.0000 mg | ORAL_TABLET | Freq: Every day | ORAL | Status: DC

## 2014-08-30 NOTE — Progress Notes (Signed)
Subjective:     Patient ID: Daniel Adams, male   DOB: 05/10/1961, 54 y.o.   MRN: [id number]  HPI Mr. Daniel Adams is a 54yo male with history of essential hypertension, chronic back pain due to spinal stenosis at S1-S2, depression, and tobacco use here today for a follow up visit and a 2 week history of numbness in his left thumb and index finger and left shoulder region. He characterizes the numbness as decreased sensation that does not feel like pins and needles.  The numbness started 2 weeks ago after he woke up in the morning. He says that he sleeps mostly on his right side, but the numbness started in his left shoulder and radiated down to his elbow. He has no numbness in his forearms, but does have numbness in left index finger and thumb.  The shoulder numbness seems to increase or decrease in severity randomly at most times but the numbness in his fingers has been constant for the past 2 weeks. He notes no aggravating or alleviating factors. He denies any pain, joint tenderness, or weakness. Patient was started on oxycodone for pain due to spinal stenosis 1.5 month ago and takes it about 3x a week. He is concerned that the 2 week onset of numbness is a side effect of oxycodone.  Patient reports that he does not like taking medications and has not been taking his blood pressure medication. He notes that he takes amlodipine only about 3 times a week. He denies any headaches, visual changes, dizziness, peripheral edema.  He has a long history of smoking 1/2 pack a day and has recently cut down to smoking 3-4 cigarettes each day. A CT of abdomen and pelvis in June 2012 showed an incidental left lower lobe lung nodule. He was scheduled for a follow up chest CT in 2013 but patient reports he did not receive a repeat scan. He denies any chest pain, shortness of breath, dyspnea on exertion, fatigue, weight loss, night sweats.   Active Ambulatory Problems    Diagnosis Date Noted  . Chronic back pain 11/04/2012   . HTN (hypertension) 03/25/2013  . Other and unspecified hyperlipidemia 05/07/2013   Resolved Ambulatory Problems    Diagnosis Date Noted  . No Resolved Ambulatory Problems   Past Medical History  Diagnosis Date  . Hypertension   . Arthritis   . Depression   . GERD (gastroesophageal reflux disease)   . Seizure disorder   . H. pylori infection      Outpatient Encounter Prescriptions as of 08/30/2014  Medication Sig  . oxyCODONE (ROXICODONE) 15 MG immediate release tablet Take 15 mg by mouth every 4 (four) hours as needed for pain.  Marland Kitchen. amLODipine (NORVASC) 10 MG tablet Take 1 tablet (10 mg total) by mouth daily.  Marland Kitchen. amoxicillin (AMOXIL) 500 MG capsule Take 1 capsule (500 mg total) by mouth 3 (three) times daily.  Marland Kitchen. atorvastatin (LIPITOR) 20 MG tablet Take 1 tablet (20 mg total) by mouth daily.  . cyclobenzaprine (FLEXERIL) 10 MG tablet Take 1 tablet (10 mg total) by mouth at bedtime.  . hydrocortisone (ANUSOL-HC) 25 MG suppository Place 1 suppository (25 mg total) rectally 2 (two) times daily.  . magnesium hydroxide (MILK OF MAGNESIA) 400 MG/5ML suspension Take 5 mLs by mouth daily as needed for mild constipation.  . RABEprazole (ACIPHEX) 20 MG tablet Take 20 mg by mouth daily.    . sildenafil (VIAGRA) 25 MG tablet Take 1 tablet (25 mg total) by mouth daily as needed for  erectile dysfunction.  . traMADol (ULTRAM) 50 MG tablet Take 1 tablet (50 mg total) by mouth every 6 (six) hours as needed.  . Vitamin D, Ergocalciferol, (DRISDOL) 50000 UNITS CAPS capsule Take 1 capsule (50,000 Units total) by mouth every 7 (seven) days.  . [DISCONTINUED] amLODipine (NORVASC) 10 MG tablet Take 1 tablet (10 mg total) by mouth daily.   History   Social History  . Marital Status: Married    Spouse Name: N/A  . Number of Children: 1  . Years of Education: N/A   Occupational History  . Unemployed    Social History Main Topics  . Smoking status: Current Every Day Smoker    Types: Cigarettes  .  Smokeless tobacco: Never Used  . Alcohol Use: No     Comment: Former  . Drug Use: No  . Sexual Activity: Not on file   Other Topics Concern  . Not on file   Social History Narrative   0 caffeine drinks      Review of Systems  Constitutional: Negative for activity change.  Respiratory: Negative for shortness of breath.   Cardiovascular: Negative for chest pain and leg swelling.  Neurological: Negative for dizziness, weakness, light-headedness and headaches.       Positive for numbness in left shoulder to elbow region and left thumb and index finger. No numbness in left forearm.   Psychiatric/Behavioral: Positive for sleep disturbance.       Objective: Filed Vitals:   08/30/14 1515  BP: 165/97  Pulse: 86  Temp: 98 F (36.7 C)  Resp: 16      Physical Exam  Constitutional:  Pleasant, soft-spoken 54yo male with cane who is alert, oriented, and in no acute distress.  Cardiovascular: Normal rate, regular rhythm and normal heart sounds.   Pulmonary/Chest: Effort normal and breath sounds normal.  Musculoskeletal: Normal range of motion. He exhibits no edema or tenderness.  He has numbness in left index finger and thumb that is Tinel's negative. No pain, weakness, tenderness, or edema.  Neurological: He has normal reflexes. He exhibits normal muscle tone.       Assessment:     Daniel Adams is a 54yo male with history of essential hypertension, chronic back pain due to spinal stenosis at S1-S2, depression, and tobacco use here today for a follow up visit and a 2 week history of numbness in his left thumb and index finger and left shoulder region.   Numbness in left arm The numbness in patient's left shoulder, index finger, and thumb is isolated and there is no associated pain, tenderness, weakness, or abnormal findings on exam. Although patient is concerned numbness is side effect of oxycodone, this is an unlikely side effect of this medication and the symptoms have started 2  weeks ago whereas patient has been taking oxycodone for 1.5 months.  Since numbness in fingers follows median nerve distribution, patient may have carpal tunnel syndrome. However, negative Tinel's test on exam makes this diagnosis less likely. Will refer patient to neurology for nerve conduction test to further explore cause of numbness.  Hypertension  Patient has not been adherent with his blood pressure medication and reports taking amlodipine only 3 times a week. BP today was 165/97. Counseled patient on importance of taking medication everyday, eating a low salt diet, and smoking cessation to prevent complications of HTN. Will schedule follow up nurse visit in 2 weeks to recheck BP to see if it is better controlled after patient resumes taking amlodipine everyday.  Tobacco  Use Patient has a long history of smoking 1/2 pack a day and has recently cut down to smoking 3-4 cigarettes each day.Given patient has history of seizures with buproprion and the history of depression is can worsened by starting him on varenicline, discussed with patient the best option for smoking cessation is to gradually reduce smoking and set quit date. Patient was receptive. Wife successfully quit smoking and is supportive of patient's decision to quit.   Lung Nodule  A CT of abdomen and pelvis in June 2012 showed an incidental left lower lobe lung nodule. He was scheduled for a follow up chest CT in 2013 but patient reports he did not receive a repeat scan.  Patient lacks constitutional symptoms such as fatigue, weight loss, night sweats, SOB concerning for a lung malignancy, but given extensive smoking history and history of lung nodule, will order CT of chest w/o contrast to determine if status of lung nodule.     Plan:     Daniel Adams was seen today for follow-up.  Diagnoses and all orders for this visit:  Essential hypertension Orders: -     CBC with Differential/Platelet -     COMPLETE METABOLIC PANEL WITH GFR -      amLODipine (NORVASC) 10 MG tablet; Take 1 tablet (10 mg total) by mouth daily.  Chronic back pain Currently following up with pain management  Lung nodule Will repeat CT scan  Numbness and tingling in left hand Referred to neurology for nerve conduction studies   Patient was seen with medical student, Agree with above assessment and plan Doris Cheadle, MD   Return in about 3 months (around 11/29/2014) for hypertension, BP check in 2 weeks/Nurse Visit.

## 2014-08-30 NOTE — Progress Notes (Signed)
Patient states he was recently prescribed oxycodone from pain  Management Patient states since taking the medication he is having some numbness to his left thumb And pointer finger as well as his left arm Patient would also like some blood work to be done as well

## 2014-08-30 NOTE — Patient Instructions (Addendum)
DASH Eating Plan DASH stands for "Dietary Approaches to Stop Hypertension." The DASH eating plan is a healthy eating plan that has been shown to reduce high blood pressure (hypertension). Additional health benefits may include reducing the risk of type 2 diabetes mellitus, heart disease, and stroke. The DASH eating plan may also help with weight loss. WHAT DO I NEED TO KNOW ABOUT THE DASH EATING PLAN? For the DASH eating plan, you will follow these general guidelines:  Choose foods with a percent daily value for sodium of less than 5% (as listed on the food label).  Use salt-free seasonings or herbs instead of table salt or sea salt.  Check with your health care provider or pharmacist before using salt substitutes.  Eat lower-sodium products, often labeled as "lower sodium" or "no salt added."  Eat fresh foods.  Eat more vegetables, fruits, and low-fat dairy products.  Choose whole grains. Look for the word "whole" as the first word in the ingredient list.  Choose fish and skinless chicken or turkey more often than red meat. Limit fish, poultry, and meat to 6 oz (170 g) each day.  Limit sweets, desserts, sugars, and sugary drinks.  Choose heart-healthy fats.  Limit cheese to 1 oz (28 g) per day.  Eat more home-cooked food and less restaurant, buffet, and fast food.  Limit fried foods.  Cook foods using methods other than frying.  Limit canned vegetables. If you do use them, rinse them well to decrease the sodium.  When eating at a restaurant, ask that your food be prepared with less salt, or no salt if possible. WHAT FOODS CAN I EAT? Seek help from a dietitian for individual calorie needs. Grains Whole grain or whole wheat bread. Brown rice. Whole grain or whole wheat pasta. Quinoa, bulgur, and whole grain cereals. Low-sodium cereals. Corn or whole wheat flour tortillas. Whole grain cornbread. Whole grain crackers. Low-sodium crackers. Vegetables Fresh or frozen vegetables  (raw, steamed, roasted, or grilled). Low-sodium or reduced-sodium tomato and vegetable juices. Low-sodium or reduced-sodium tomato sauce and paste. Low-sodium or reduced-sodium canned vegetables.  Fruits All fresh, canned (in natural juice), or frozen fruits. Meat and Other Protein Products Ground beef (85% or leaner), grass-fed beef, or beef trimmed of fat. Skinless chicken or turkey. Ground chicken or turkey. Pork trimmed of fat. All fish and seafood. Eggs. Dried beans, peas, or lentils. Unsalted nuts and seeds. Unsalted canned beans. Dairy Low-fat dairy products, such as skim or 1% milk, 2% or reduced-fat cheeses, low-fat ricotta or cottage cheese, or plain low-fat yogurt. Low-sodium or reduced-sodium cheeses. Fats and Oils Tub margarines without trans fats. Light or reduced-fat mayonnaise and salad dressings (reduced sodium). Avocado. Safflower, olive, or canola oils. Natural peanut or almond butter. Other Unsalted popcorn and pretzels. The items listed above may not be a complete list of recommended foods or beverages. Contact your dietitian for more options. WHAT FOODS ARE NOT RECOMMENDED? Grains White bread. White pasta. White rice. Refined cornbread. Bagels and croissants. Crackers that contain trans fat. Vegetables Creamed or fried vegetables. Vegetables in a cheese sauce. Regular canned vegetables. Regular canned tomato sauce and paste. Regular tomato and vegetable juices. Fruits Dried fruits. Canned fruit in light or heavy syrup. Fruit juice. Meat and Other Protein Products Fatty cuts of meat. Ribs, chicken wings, bacon, sausage, bologna, salami, chitterlings, fatback, hot dogs, bratwurst, and packaged luncheon meats. Salted nuts and seeds. Canned beans with salt. Dairy Whole or 2% milk, cream, half-and-half, and cream cheese. Whole-fat or sweetened yogurt. Full-fat   cheeses or blue cheese. Nondairy creamers and whipped toppings. Processed cheese, cheese spreads, or cheese  curds. Condiments Onion and garlic salt, seasoned salt, table salt, and sea salt. Canned and packaged gravies. Worcestershire sauce. Tartar sauce. Barbecue sauce. Teriyaki sauce. Soy sauce, including reduced sodium. Steak sauce. Fish sauce. Oyster sauce. Cocktail sauce. Horseradish. Ketchup and mustard. Meat flavorings and tenderizers. Bouillon cubes. Hot sauce. Tabasco sauce. Marinades. Taco seasonings. Relishes. Fats and Oils Butter, stick margarine, lard, shortening, ghee, and bacon fat. Coconut, palm kernel, or palm oils. Regular salad dressings. Other Pickles and olives. Salted popcorn and pretzels. The items listed above may not be a complete list of foods and beverages to avoid. Contact your dietitian for more information. WHERE CAN I FIND MORE INFORMATION? National Heart, [employer], [employer]: www.nhlbi.nih.gov/health/health-topics/topics/dash/ Document Released: 05/03/2011 Document Revised: 09/28/2013 Document Reviewed: 03/18/2013 Exit[employer] Patient Information 2015 [employer]. This information is not intended to replace advice given to you by your health care provider. Make sure you discuss any questions you have with your health care provider. Smoking Cessation Quitting smoking is important to your health and has many advantages. However, it is not always easy to quit since nicotine is a very addictive drug. Oftentimes, people try 3 times or more before being able to quit. This document explains the best ways for you to prepare to quit smoking. Quitting takes hard work and a lot of effort, but you can do it. ADVANTAGES OF QUITTING SMOKING  You will live longer, feel better, and live better.  Your body will feel the impact of quitting smoking almost immediately.  Within 20 minutes, blood pressure decreases. Your pulse returns to its normal level.  After 8 hours, carbon monoxide levels in the blood return to normal. Your oxygen level increases.  After 24 hours, the chance of  having a heart attack starts to decrease. Your breath, hair, and body stop smelling like smoke.  After 48 hours, damaged nerve endings begin to recover. Your sense of taste and smell improve.  After 72 hours, the body is virtually free of nicotine. Your bronchial tubes relax and breathing becomes easier.  After 2 to 12 weeks, lungs can hold more air. Exercise becomes easier and circulation improves.  The risk of having a heart attack, stroke, cancer, or lung disease is greatly reduced.  After 1 year, the risk of coronary heart disease is cut in half.  After 5 years, the risk of stroke falls to the same as a nonsmoker.  After 10 years, the risk of lung cancer is cut in half and the risk of other cancers decreases significantly.  After 15 years, the risk of coronary heart disease drops, usually to the level of a nonsmoker.  If you are pregnant, quitting smoking will improve your chances of having a healthy baby.  The people you live with, especially any children, will be healthier.  You will have extra money to spend on things other than cigarettes. QUESTIONS TO THINK ABOUT BEFORE ATTEMPTING TO QUIT You may want to talk about your answers with your health care provider.  Why do you want to quit?  If you tried to quit in the past, what helped and what did not?  What will be the most difficult situations for you after you quit? How will you plan to handle them?  Who can help you through the tough times? Your family? Friends? A health care provider?  What pleasures do you get from smoking? What ways can you still get pleasure   if you quit? Here are some questions to ask your health care provider:  How can you help me to be successful at quitting?  What medicine do you think would be best for me and how should I take it?  What should I do if I need more help?  What is smoking withdrawal like? How can I get information on withdrawal? GET READY  Set a quit date.  Change your  environment by getting rid of all cigarettes, ashtrays, matches, and lighters in your home, car, or work. Do not let people smoke in your home.  Review your past attempts to quit. Think about what worked and what did not. GET SUPPORT AND ENCOURAGEMENT You have a better chance of being successful if you have help. You can get support in many ways.  Tell your family, friends, and coworkers that you are going to quit and need their support. Ask them not to smoke around you.  Get individual, group, or telephone counseling and support. Programs are available at local hospitals and health centers. Call your local health department for information about programs in your area.  Spiritual beliefs and practices may help some smokers quit.  Download a "quit meter" on your computer to keep track of quit statistics, such as how long you have gone without smoking, cigarettes not smoked, and money saved.  Get a self-help book about quitting smoking and staying off tobacco. LEARN NEW SKILLS AND BEHAVIORS  Distract yourself from urges to smoke. Talk to someone, go for a walk, or occupy your time with a task.  Change your normal routine. Take a different route to work. Drink tea instead of coffee. Eat breakfast in a different place.  Reduce your stress. Take a hot bath, exercise, or read a book.  Plan something enjoyable to do every day. Reward yourself for not smoking.  Explore interactive web-based programs that specialize in helping you quit. GET MEDICINE AND USE IT CORRECTLY Medicines can help you stop smoking and decrease the urge to smoke. Combining medicine with the above behavioral methods and support can greatly increase your chances of successfully quitting smoking.  Nicotine replacement therapy helps deliver nicotine to your body without the negative effects and risks of smoking. Nicotine replacement therapy includes nicotine gum, lozenges, inhalers, nasal sprays, and skin patches. Some may be  available over-the-counter and others require a prescription.  Antidepressant medicine helps people abstain from smoking, but how this works is unknown. This medicine is available by prescription.  Nicotinic receptor partial agonist medicine simulates the effect of nicotine in your brain. This medicine is available by prescription. Ask your health care provider for advice about which medicines to use and how to use them based on your health history. Your health care provider will tell you what side effects to look out for if you choose to be on a medicine or therapy. Carefully read the information on the package. Do not use any other product containing nicotine while using a nicotine replacement product.  RELAPSE OR DIFFICULT SITUATIONS Most relapses occur within the first 3 months after quitting. Do not be discouraged if you start smoking again. Remember, most people try several times before finally quitting. You may have symptoms of withdrawal because your body is used to nicotine. You may crave cigarettes, be irritable, feel very hungry, cough often, get headaches, or have difficulty concentrating. The withdrawal symptoms are only temporary. They are strongest when you first quit, but they will go away within 10-14 days. To reduce the   chances of relapse, try to:  Avoid drinking alcohol. Drinking lowers your chances of successfully quitting.  Reduce the amount of caffeine you consume. Once you quit smoking, the amount of caffeine in your body increases and can give you symptoms, such as a rapid heartbeat, sweating, and anxiety.  Avoid smokers because they can make you want to smoke.  Do not let weight gain distract you. Many smokers will gain weight when they quit, usually less than 10 pounds. Eat a healthy diet and stay active. You can always lose the weight gained after you quit.  Find ways to improve your mood other than smoking. FOR MORE INFORMATION  www.smokefree.gov  Document Released:  05/08/2001 Document Revised: 09/28/2013 Document Reviewed: 08/23/2011 [employer] Patient Information 2015 [employer]. This information is not intended to replace advice given to you by your health care provider. Make sure you discuss any questions you have with your health care provider.  

## 2014-09-03 ENCOUNTER — Telehealth: Payer: Self-pay

## 2014-09-03 NOTE — Telephone Encounter (Signed)
-----   Message from Doris Cheadleeepak Advani, MD sent at 08/31/2014  9:19 AM EDT ----- Call and let the Patient know that blood work is normal.

## 2014-09-03 NOTE — Telephone Encounter (Signed)
Patient not available Left message on voice mail to return our call 

## 2014-09-06 ENCOUNTER — Ambulatory Visit (HOSPITAL_COMMUNITY): Payer: Medicaid Other

## 2014-09-10 ENCOUNTER — Ambulatory Visit (HOSPITAL_COMMUNITY)
Admission: RE | Admit: 2014-09-10 | Discharge: 2014-09-10 | Disposition: A | Discharge status: Home / Self Care | Payer: Medicaid Other | Source: Ambulatory Visit | Attending: Internal Medicine | Admitting: Internal Medicine

## 2014-09-10 DIAGNOSIS — R911 Solitary pulmonary nodule: Secondary | ICD-10-CM | POA: Diagnosis present

## 2014-09-13 ENCOUNTER — Telehealth: Payer: Self-pay

## 2014-09-13 NOTE — Telephone Encounter (Signed)
-----   Message from Doris Cheadleeepak Advani, MD sent at 09/13/2014  9:43 AM EDT ----- Call and let the patient know that his CT scan is negative and no significant abnormalities found. No hilar  or mediastinal adenopathy or lung nodule.

## 2014-09-13 NOTE — Telephone Encounter (Signed)
Patient not available Left message on voice mail to return our call 

## 2014-09-17 ENCOUNTER — Ambulatory Visit: Payer: Medicaid Other | Attending: Internal Medicine | Admitting: *Deleted

## 2014-09-17 VITALS — BP 117/71 | HR 76 | Temp 98.4°F | Resp 16

## 2014-09-17 DIAGNOSIS — I10 Essential (primary) hypertension: Secondary | ICD-10-CM | POA: Diagnosis present

## 2014-09-17 DIAGNOSIS — F17210 Nicotine dependence, cigarettes, uncomplicated: Secondary | ICD-10-CM | POA: Insufficient documentation

## 2014-09-17 NOTE — Progress Notes (Signed)
Patient presents with wife for BP check Med list reviewed; states taking amlodipine 10 mg daily as directed States he was told by a provider he could stop atorvastatin 20 mg about 6 months ago. Discussed need for low sodium diet and using Mrs. Dash as alternative to salt Encouraged to choose foods with 5% or less of daily value for sodium. Discussed walking 30 minutes per day for exercise. States he and wife are planning to begin walking tomorrow for exercise Patient denies headaches, blurred vision, SHOB, chest pain or pressure Smoking 3-4 cigs per day; wants to quit Lab results and Chest CT results reviewed with patient  Filed Vitals:   09/17/14 1547  BP: 117/71  Pulse: 76  Temp: 98.4 F (36.9 C)  Resp: 16    Patient advised to call for med refills at least 7 days before running out so as not to go without.  Patient aware that he is to f/u with PCP 3 months from last visit (Due 11/29/14)  Patient given literature on DASH Eating Plan and smoking cessation  Note sent to referral specialist regarding referral to neurology

## 2014-09-17 NOTE — Patient Instructions (Signed)
Smoking Cessation Quitting smoking is important to your health and has many advantages. However, it is not always easy to quit since nicotine is a very addictive drug. Oftentimes, people try 3 times or more before being able to quit. This document explains the best ways for you to prepare to quit smoking. Quitting takes hard work and a lot of effort, but you can do it. ADVANTAGES OF QUITTING SMOKING  You will live longer, feel better, and live better.  Your body will feel the impact of quitting smoking almost immediately.  Within 20 minutes, blood pressure decreases. Your pulse returns to its normal level.  After 8 hours, carbon monoxide levels in the blood return to normal. Your oxygen level increases.  After 24 hours, the chance of having a heart attack starts to decrease. Your breath, hair, and body stop smelling like smoke.  After 48 hours, damaged nerve endings begin to recover. Your sense of taste and smell improve.  After 72 hours, the body is virtually free of nicotine. Your bronchial tubes relax and breathing becomes easier.  After 2 to 12 weeks, lungs can hold more air. Exercise becomes easier and circulation improves.  The risk of having a heart attack, stroke, cancer, or lung disease is greatly reduced.  After 1 year, the risk of coronary heart disease is cut in half.  After 5 years, the risk of stroke falls to the same as a nonsmoker.  After 10 years, the risk of lung cancer is cut in half and the risk of other cancers decreases significantly.  After 15 years, the risk of coronary heart disease drops, usually to the level of a nonsmoker.  If you are pregnant, quitting smoking will improve your chances of having a healthy baby.  The people you live with, especially any children, will be healthier.  You will have extra money to spend on things other than cigarettes. QUESTIONS TO THINK ABOUT BEFORE ATTEMPTING TO QUIT You may want to talk about your answers with your  health care provider.  Why do you want to quit?  If you tried to quit in the past, what helped and what did not?  What will be the most difficult situations for you after you quit? How will you plan to handle them?  Who can help you through the tough times? Your family? Friends? A health care provider?  What pleasures do you get from smoking? What ways can you still get pleasure if you quit? Here are some questions to ask your health care provider:  How can you help me to be successful at quitting?  What medicine do you think would be best for me and how should I take it?  What should I do if I need more help?  What is smoking withdrawal like? How can I get information on withdrawal? GET READY  Set a quit date.  Change your environment by getting rid of all cigarettes, ashtrays, matches, and lighters in your home, car, or work. Do not let people smoke in your home.  Review your past attempts to quit. Think about what worked and what did not. GET SUPPORT AND ENCOURAGEMENT You have a better chance of being successful if you have help. You can get support in many ways.  Tell your family, friends, and coworkers that you are going to quit and need their support. Ask them not to smoke around you.  Get individual, group, or telephone counseling and support. Programs are available at local hospitals and health centers. Call   your local health department for information about programs in your area.  Spiritual beliefs and practices may help some smokers quit.  Download a "quit meter" on your computer to keep track of quit statistics, such as how long you have gone without smoking, cigarettes not smoked, and money saved.  Get a self-help book about quitting smoking and staying off tobacco. LEARN NEW SKILLS AND BEHAVIORS  Distract yourself from urges to smoke. Talk to someone, go for a walk, or occupy your time with a task.  Change your normal routine. Take a different route to work.  Drink tea instead of coffee. Eat breakfast in a different place.  Reduce your stress. Take a hot bath, exercise, or read a book.  Plan something enjoyable to do every day. Reward yourself for not smoking.  Explore interactive web-based programs that specialize in helping you quit. GET MEDICINE AND USE IT CORRECTLY Medicines can help you stop smoking and decrease the urge to smoke. Combining medicine with the above behavioral methods and support can greatly increase your chances of successfully quitting smoking.  Nicotine replacement therapy helps deliver nicotine to your body without the negative effects and risks of smoking. Nicotine replacement therapy includes nicotine gum, lozenges, inhalers, nasal sprays, and skin patches. Some may be available over-the-counter and others require a prescription.  Antidepressant medicine helps people abstain from smoking, but how this works is unknown. This medicine is available by prescription.  Nicotinic receptor partial agonist medicine simulates the effect of nicotine in your brain. This medicine is available by prescription. Ask your health care provider for advice about which medicines to use and how to use them based on your health history. Your health care provider will tell you what side effects to look out for if you choose to be on a medicine or therapy. Carefully read the information on the package. Do not use any other product containing nicotine while using a nicotine replacement product.  RELAPSE OR DIFFICULT SITUATIONS Most relapses occur within the first 3 months after quitting. Do not be discouraged if you start smoking again. Remember, most people try several times before finally quitting. You may have symptoms of withdrawal because your body is used to nicotine. You may crave cigarettes, be irritable, feel very hungry, cough often, get headaches, or have difficulty concentrating. The withdrawal symptoms are only temporary. They are strongest  when you first quit, but they will go away within 10-14 days. To reduce the chances of relapse, try to:  Avoid drinking alcohol. Drinking lowers your chances of successfully quitting.  Reduce the amount of caffeine you consume. Once you quit smoking, the amount of caffeine in your body increases and can give you symptoms, such as a rapid heartbeat, sweating, and anxiety.  Avoid smokers because they can make you want to smoke.  Do not let weight gain distract you. Many smokers will gain weight when they quit, usually less than 10 pounds. Eat a healthy diet and stay active. You can always lose the weight gained after you quit.  Find ways to improve your mood other than smoking. FOR MORE INFORMATION  www.smokefree.gov  Document Released: 05/08/2001 Document Revised: 09/28/2013 Document Reviewed: 08/23/2011 ExitCare Patient Information 2015 [employer]. This information is not intended to replace advice given to you by your health care provider. Make sure you discuss any questions you have with your health care provider. DASH Eating Plan DASH stands for "Dietary Approaches to Stop Hypertension." The DASH eating plan is a healthy eating plan that has   been shown to reduce high blood pressure (hypertension). Additional health benefits may include reducing the risk of type 2 diabetes mellitus, heart disease, and stroke. The DASH eating plan may also help with weight loss. WHAT DO I NEED TO KNOW ABOUT THE DASH EATING PLAN? For the DASH eating plan, you will follow these general guidelines:  Choose foods with a percent daily value for sodium of less than 5% (as listed on the food label).  Use salt-free seasonings or herbs instead of table salt or sea salt.  Check with your health care provider or pharmacist before using salt substitutes.  Eat lower-sodium products, often labeled as "lower sodium" or "no salt added."  Eat fresh foods.  Eat more vegetables, fruits, and low-fat dairy  products.  Choose whole grains. Look for the word "whole" as the first word in the ingredient list.  Choose fish and skinless chicken or turkey more often than red meat. Limit fish, poultry, and meat to 6 oz (170 g) each day.  Limit sweets, desserts, sugars, and sugary drinks.  Choose heart-healthy fats.  Limit cheese to 1 oz (28 g) per day.  Eat more home-cooked food and less restaurant, buffet, and fast food.  Limit fried foods.  Cook foods using methods other than frying.  Limit canned vegetables. If you do use them, rinse them well to decrease the sodium.  When eating at a restaurant, ask that your food be prepared with less salt, or no salt if possible. WHAT FOODS CAN I EAT? Seek help from a dietitian for individual calorie needs. Grains Whole grain or whole wheat bread. Brown rice. Whole grain or whole wheat pasta. Quinoa, bulgur, and whole grain cereals. Low-sodium cereals. Corn or whole wheat flour tortillas. Whole grain cornbread. Whole grain crackers. Low-sodium crackers. Vegetables Fresh or frozen vegetables (raw, steamed, roasted, or grilled). Low-sodium or reduced-sodium tomato and vegetable juices. Low-sodium or reduced-sodium tomato sauce and paste. Low-sodium or reduced-sodium canned vegetables.  Fruits All fresh, canned (in natural juice), or frozen fruits. Meat and Other Protein Products Ground beef (85% or leaner), grass-fed beef, or beef trimmed of fat. Skinless chicken or turkey. Ground chicken or turkey. Pork trimmed of fat. All fish and seafood. Eggs. Dried beans, peas, or lentils. Unsalted nuts and seeds. Unsalted canned beans. Dairy Low-fat dairy products, such as skim or 1% milk, 2% or reduced-fat cheeses, low-fat ricotta or cottage cheese, or plain low-fat yogurt. Low-sodium or reduced-sodium cheeses. Fats and Oils Tub margarines without trans fats. Light or reduced-fat mayonnaise and salad dressings (reduced sodium). Avocado. Safflower, olive, or canola  oils. Natural peanut or almond butter. Other Unsalted popcorn and pretzels. The items listed above may not be a complete list of recommended foods or beverages. Contact your dietitian for more options. WHAT FOODS ARE NOT RECOMMENDED? Grains White bread. White pasta. White rice. Refined cornbread. Bagels and croissants. Crackers that contain trans fat. Vegetables Creamed or fried vegetables. Vegetables in a cheese sauce. Regular canned vegetables. Regular canned tomato sauce and paste. Regular tomato and vegetable juices. Fruits Dried fruits. Canned fruit in light or heavy syrup. Fruit juice. Meat and Other Protein Products Fatty cuts of meat. Ribs, chicken wings, bacon, sausage, bologna, salami, chitterlings, fatback, hot dogs, bratwurst, and packaged luncheon meats. Salted nuts and seeds. Canned beans with salt. Dairy Whole or 2% milk, cream, half-and-half, and cream cheese. Whole-fat or sweetened yogurt. Full-fat cheeses or blue cheese. Nondairy creamers and whipped toppings. Processed cheese, cheese spreads, or cheese curds. Condiments Onion and garlic salt,   seasoned salt, table salt, and sea salt. Canned and packaged gravies. Worcestershire sauce. Tartar sauce. Barbecue sauce. Teriyaki sauce. Soy sauce, including reduced sodium. Steak sauce. Fish sauce. Oyster sauce. Cocktail sauce. Horseradish. Ketchup and mustard. Meat flavorings and tenderizers. Bouillon cubes. Hot sauce. Tabasco sauce. Marinades. Taco seasonings. Relishes. Fats and Oils Butter, stick margarine, lard, shortening, ghee, and bacon fat. Coconut, palm kernel, or palm oils. Regular salad dressings. Other Pickles and olives. Salted popcorn and pretzels. The items listed above may not be a complete list of foods and beverages to avoid. Contact your dietitian for more information. WHERE CAN I FIND MORE INFORMATION? National Heart, Lung, and Blood Institute: www.nhlbi.nih.gov/health/health-topics/topics/dash/ Document Released:  05/03/2011 Document Revised: 09/28/2013 Document Reviewed: 03/18/2013 ExitCare Patient Information 2015 [employer]. This information is not intended to replace advice given to you by your health care provider. Make sure you discuss any questions you have with your health care provider.  

## 2014-10-05 ENCOUNTER — Ambulatory Visit (INDEPENDENT_AMBULATORY_CARE_PROVIDER_SITE_OTHER): Payer: Medicaid Other | Admitting: Neurology

## 2014-10-05 ENCOUNTER — Encounter: Payer: Self-pay | Admitting: Neurology

## 2014-10-05 VITALS — BP 112/68 | HR 68 | Resp 16 | Ht 64.0 in | Wt 154.8 lb

## 2014-10-05 DIAGNOSIS — G8929 Other chronic pain: Secondary | ICD-10-CM | POA: Diagnosis not present

## 2014-10-05 DIAGNOSIS — M7551 Bursitis of right shoulder: Secondary | ICD-10-CM

## 2014-10-05 DIAGNOSIS — M542 Cervicalgia: Secondary | ICD-10-CM | POA: Insufficient documentation

## 2014-10-05 DIAGNOSIS — F418 Other specified anxiety disorders: Secondary | ICD-10-CM | POA: Insufficient documentation

## 2014-10-05 DIAGNOSIS — R569 Unspecified convulsions: Secondary | ICD-10-CM | POA: Diagnosis not present

## 2014-10-05 DIAGNOSIS — M549 Dorsalgia, unspecified: Secondary | ICD-10-CM

## 2014-10-05 DIAGNOSIS — G4700 Insomnia, unspecified: Secondary | ICD-10-CM | POA: Diagnosis not present

## 2014-10-05 MED ORDER — CYCLOBENZAPRINE HCL 5 MG PO TABS: 5.0000 mg | ORAL_TABLET | Freq: Every day | ORAL | Status: DC

## 2014-10-05 NOTE — Progress Notes (Signed)
[facility]  PATIENT: Daniel Adams DOB: 1960-11-27  REFERRING DOCTOR OR PCP:  Lorayne Marek SOURCE: patient and records  _________________________________   HISTORICAL  CHIEF COMPLAINT:  Chief Complaint  Patient presents with  . Neck Pain    Sts. onset of intermittent neck pain, numbness right shoulder, left arm, left hand, left thumb and index fingers.  No known injury./fim.   . Back Pain    He is a current pt. of [facility] for lbp.  They rx. Oxycodone for same/fim    HISTORY OF PRESENT ILLNESS:  I had the pleasure of seeing your patient, Carvell Hoeffner, at [facility] for a neurologic consultation regarding his neck and left arm pain.   About 3 weeks ago, he started having neck pain, right shoulder pain and numbness in the left arm.  In the left arm, he has numbness that enters the left hand, particularly the thumb and the index finger. He notes some weakness in the right arm, especially with movements involving the shoulder. When he flexes his head to the left, he gets more neck and right shoulder pain. Externally rotating and elevating the right shoulder increases the shoulder pain some. He has not had any imaging studies in the last couple weeks but he is scheduled to have an x-ray of the neck.  The onset of the pain 3 weeks ago was not associated with any activity or trauma --- it just started when he woke up that day.      He denies any bladder changes.   He notes some constipation.    He takes clonazepam at night for anxiety and insomnia.   He has more trouble with sleep onset than with sleep maintenance.     He had seizures in the past and fell on right shoulder several times.   First seizure followed Wellbutrin use. Other seizures may have been associated with alcohol use/withdrawal.    He has depression and anxiety.   Cymbalta helped but he wants to take fewer medications and stopped.     He has lumbar spinal stenosis and  sees Dr. Eulis Manly ([facility]).   He takes oxycodone 15 mg tablets for his back. However it has not helped the pain in the neck and the shoulder.   In the past, he was on gabapentin without much benefit  REVIEW OF SYSTEMS: Constitutional: No fevers, chills, sweats, or change in appetite.   He has insomnia and feels fatigued Eyes: No visual changes, double vision, eye pain Ear, nose and throat: No hearing loss, ear pain, nasal congestion, sore throat Cardiovascular: No chest pain, palpitations Respiratory: No shortness of breath at rest or with exertion.   No wheezes.   He has snoring GastrointestinaI: No nausea, vomiting, diarrhea, abdominal pain, fecal incontinence Genitourinary: No dysuria, urinary retention or frequency.  No nocturia. Musculoskeletal: No neck pain, back pain Integumentary: No rash, pruritus, skin lesions Neurological: as above Psychiatric: Notes depressionand anxiety Endocrine: No palpitations, diaphoresis, change in appetite, change in weigh or increased thirst Hematologic/Lymphatic: No anemia, purpura, petechiae. Allergic/Immunologic: No itchy/runny eyes, nasal congestion, recent allergic reactions, rashes  ALLERGIES: Allergies  Allergen Reactions  . Wellbutrin [Bupropion Hcl]     HOME MEDICATIONS:  Current outpatient prescriptions:  .  amLODipine (NORVASC) 10 MG tablet, Take 1 tablet (10 mg total) by mouth daily., Disp: 30 tablet, Rfl: 5 .  clonazePAM (KLONOPIN) 0.5 MG tablet, Take 0.5 mg by mouth at bedtime as needed for anxiety., Disp: , Rfl:  .  magnesium hydroxide (MILK OF MAGNESIA) 400 MG/5ML suspension, Take 5 mLs by mouth daily as needed for mild constipation., Disp: 360 mL, Rfl: 0 .  oxyCODONE (ROXICODONE) 15 MG immediate release tablet, Take 15 mg by mouth every 4 (four) hours as needed for pain., Disp: , Rfl:  .  QUEtiapine (SEROQUEL) 100 MG tablet, Take 100 mg by mouth., Disp: , Rfl:  .  traMADol (ULTRAM) 50 MG tablet, Take 1 tablet (50 mg  total) by mouth every 6 (six) hours as needed. (Patient not taking: Reported on 10/05/2014), Disp: 120 tablet, Rfl: 0 .  [DISCONTINUED] DULoxetine (CYMBALTA) 30 MG capsule, Take 30 mg by mouth 2 (two) times daily.  , Disp: , Rfl:  .  [DISCONTINUED] gabapentin (NEURONTIN) 600 MG tablet, Take 600 mg by mouth 3 (three) times daily.  , Disp: , Rfl:  .  [DISCONTINUED] gemfibrozil (LOPID) 600 MG tablet, Take 600 mg by mouth 2 (two) times daily before a meal.  , Disp: , Rfl:  .  [DISCONTINUED] lisinopril (PRINIVIL,ZESTRIL) 20 MG tablet, Take 20 mg by mouth daily.  , Disp: , Rfl:  .  [DISCONTINUED] venlafaxine (EFFEXOR-XR) 150 MG 24 hr capsule, Take 150 mg by mouth daily.  , Disp: , Rfl:   PAST MEDICAL HISTORY: Past Medical History  Diagnosis Date  . Hypertension   . Arthritis   . Depression   . GERD (gastroesophageal reflux disease)   . Seizure disorder   . H. pylori infection     DX blood test     PAST SURGICAL HISTORY: Past Surgical History  Procedure Laterality Date  . Knee arthroscopy      FAMILY HISTORY: Family History  Problem Relation Age of Onset  . Colon cancer Paternal Aunt     SOCIAL HISTORY:  History   Social History  . Marital Status: Married    Spouse Name: N/A  . Number of Children: 1  . Years of Education: N/A   Occupational History  . Unemployed    Social History Main Topics  . Smoking status: Current Every Day Smoker    Types: Cigarettes  . Smokeless tobacco: Never Used     Comment: Smoking 3-4 cigs/ day  . Alcohol Use: No     Comment: Former  . Drug Use: No  . Sexual Activity: Not on file   Other Topics Concern  . Not on file   Social History Narrative   0 caffeine drinks      PHYSICAL EXAM  Filed Vitals:   10/05/14 1359  BP: 112/68  Pulse: 68  Resp: 16  Height: $Remove'5\' 4"'[id number]$  (1.626 m)  Weight: 154 lb 12.8 oz (70.217 kg)    Body mass index is 26.56 kg/(m^2).   General: The patient is well-developed and well-nourished and in no acute  distress  Neck: The neck is supple, no carotid bruits are noted.  The neck is mildly tender around C6-C7.  Range of motion of the neck is mildly reduced  Cardiovascular: The heart has a regular rate and rhythm with a normal S1 and S2. There were no murmurs, gallops or rubs. Lungs are clear to auscultation.  Skin: Extremities are without significant edema.  Musculoskeletal: He has moderate tenderness around C6-C7 in the paraspinal muscles and over the subacromial bursa on the right right shoulder range of motion is slightly reduced.  Neurologic Exam  Mental status: The patient is alert and oriented x 3 at the time of the examination. The patient has apparent normal recent and remote memory,  with an apparently normal attention span and concentration ability.   Speech is normal.  Cranial nerves: Extraocular movements are full. Pupils are equal, round, and reactive to light and accomodation.    Facial symmetry is present. There is good facial sensation to soft touch bilaterally.Facial strength is normal.  Trapezius and sternocleidomastoid strength is normal. No dysarthria is noted.  The tongue is midline, and the patient has symmetric elevation of the soft palate. No obvious hearing deficits are noted.  Motor:  Muscle bulk is normal.   Tone is normal. Strength is  5 / 5 in all 4 extremities.   Sensory: Sensory testing is intact to pinprick, soft touch and vibration sensation in all 4 extremities.  Coordination: Cerebellar testing reveals good finger-nose-finger and heel-to-shin bilaterally.  Gait and station: Station is normal.   Gait is normal. Tandem gait is normal. Romberg is negative.   Reflexes: Deep tendon reflexes are symmetric and normal bilaterally in arms but brisk in legs with spread at the knees.   Plantar responses are flexor.    DIAGNOSTIC DATA (LABS, IMAGING, TESTING) - I reviewed patient records, labs, notes, testing and imaging myself where available.  Lab Results    Component Value Date   WBC 9.0 08/30/2014   HGB 14.3 08/30/2014   HCT 42.2 08/30/2014   MCV 85.3 08/30/2014   PLT 358 08/30/2014      Component Value Date/Time   NA 138 08/30/2014 1623   K 4.9 08/30/2014 1623   CL 102 08/30/2014 1623   CO2 26 08/30/2014 1623   GLUCOSE 96 08/30/2014 1623   BUN 10 08/30/2014 1623   CREATININE 0.89 08/30/2014 1623   CREATININE 0.80 10/07/2010 1704   CALCIUM 9.2 08/30/2014 1623   PROT 6.7 08/30/2014 1623   ALBUMIN 4.2 08/30/2014 1623   AST 12 08/30/2014 1623   ALT 9 08/30/2014 1623   ALKPHOS 78 08/30/2014 1623   BILITOT 0.4 08/30/2014 1623   GFRNONAA >89 08/30/2014 1623   GFRNONAA >60 10/29/2008 1548   GFRAA >89 08/30/2014 1623   GFRAA  10/29/2008 1548    >60        The eGFR has been calculated using the MDRD equation. This calculation has not been validated in all clinical situations. eGFR's persistently <60 mL/min signify possible Chronic Kidney Disease.   Lab Results  Component Value Date   CHOL 159 04/15/2014   HDL 38* 04/15/2014   LDLCALC 92 04/15/2014   TRIG 145 04/15/2014   CHOLHDL 4.2 04/15/2014       ASSESSMENT AND PLAN  Neck pain  Subacromial or subdeltoid bursitis, right  Insomnia  Depression with anxiety  Chronic back pain  Convulsions, unspecified convulsion type    In summary, Mr. above proper is a 54 year old man with recent onset of neck pain, right shoulder pain and left arm numbness. On examination, strength and sensation and reflexes were fairly symmetric. He was tender over the neck and the subacromial bursa on the right. To help with the pain, I did a right subacromial bursa injection with 40 mg of Depo-Medrol in Marcaine using sterile technique.  He tolerated the procedure well and felt better a few minutes later., Trigger point injection of the C6 and C7 paraspinal muscles bilaterally was performed with 40 milligrams Depo-Medrol and Marcaine.  He tolerated that well. Because he has a history of  alcohol abuse in the past, I'm reluctant to add an NSAID at this time but would consider doing so if pain persists. Additionally, the pain  persists we will consider checking an MRI to better assess the possible left C6 radiculopathy.   5 mg cyclobenzaprine was added to try to help with his nighttime pain and insomnia.  He will return to see me in 2 months but call sooner if he is no better or notes worsening symptoms.      Nasrin Lanzo A. Felecia Shelling, MD, PhD 1/55/2080, 2:23 PM Certified in Neurology, Clinical Neurophysiology, Sleep Medicine, Pain Medicine and Neuroimaging  [facility] [address] [place], [place] [address] [phone]

## 2014-10-11 ENCOUNTER — Other Ambulatory Visit: Payer: Self-pay | Admitting: Physician Assistant

## 2014-10-11 ENCOUNTER — Ambulatory Visit
Admission: RE | Admit: 2014-10-11 | Discharge: 2014-10-11 | Disposition: A | Discharge status: Home / Self Care | Payer: Medicaid Other | Source: Ambulatory Visit | Attending: Physician Assistant | Admitting: Physician Assistant

## 2014-10-11 DIAGNOSIS — M542 Cervicalgia: Secondary | ICD-10-CM

## 2014-12-07 ENCOUNTER — Encounter: Payer: Self-pay | Admitting: Neurology

## 2014-12-07 ENCOUNTER — Ambulatory Visit (INDEPENDENT_AMBULATORY_CARE_PROVIDER_SITE_OTHER): Payer: Medicaid Other | Admitting: Neurology

## 2014-12-07 VITALS — BP 138/92 | HR 76 | Resp 14 | Ht 64.0 in | Wt 154.0 lb

## 2014-12-07 DIAGNOSIS — M25511 Pain in right shoulder: Secondary | ICD-10-CM

## 2014-12-07 DIAGNOSIS — M542 Cervicalgia: Secondary | ICD-10-CM

## 2014-12-07 NOTE — Progress Notes (Signed)
[facility]  PATIENT: Daniel Adams DOB: 04-10-1961  REFERRING DOCTOR OR PCP:  Lorayne Marek SOURCE: patient and records  _________________________________   HISTORICAL  CHIEF COMPLAINT:  Chief Complaint  Patient presents with  . Neck Pain    Sts. neck pain, right sholder pain is worse since last ov.  He sts. he does not feel inj. given at last ov helped./fim    HISTORY OF PRESENT ILLNESS:  Daniel Adams is a 54yo man with neck pain , right shoulder and arm pain.   The worse pain is near the tip of the shoulder blade.  He also has pain in the neck.    At the last visit, we did a right subacromial bursa injection and trigger point injections into some neck muscles.   Unfortunately, he did not feel that he got much benefit from the shots.   Pain seems a little worse now than last month.     The left arm numbness is better.   He denies weakness.   Laying on the right shoulder or raising the right arm increases his pain.  Also more pain when he looks to the right.   Pain is better laying on left side. 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      Cyclobenzaprine did not help.    He denies any bladder changes.   He notes some constipation.    He has lumbar spinal stenosis and sees Dr. Eulis Manly ([facility]   He takes oxycodone 15 mg tablets for his back. However it has not helped the pain in the neck and the shoulder.   In the past, he was on gabapentin without much benefit  REVIEW OF SYSTEMS: Constitutional: No fevers, chills, sweats, or change in appetite.   He has insomnia and feels fatigued Eyes: No visual changes, double vision, eye pain Ear, nose and throat: No hearing loss, ear pain, nasal congestion, sore throat Cardiovascular: No chest pain, palpitations Respiratory: No shortness of breath at rest or with exertion.   No wheezes.   He has snoring GastrointestinaI: No nausea, vomiting, diarrhea, abdominal pain, fecal  incontinence Genitourinary: No dysuria, urinary retention or frequency.  No nocturia. Musculoskeletal: No neck pain, back pain Integumentary: No rash, pruritus, skin lesions Neurological: as above Psychiatric: Notes depressionand anxiety Endocrine: No palpitations, diaphoresis, change in appetite, change in weigh or increased thirst Hematologic/Lymphatic: No anemia, purpura, petechiae. Allergic/Immunologic: No itchy/runny eyes, nasal congestion, recent allergic reactions, rashes  ALLERGIES: Allergies  Allergen Reactions  . Wellbutrin [Bupropion Hcl]     HOME MEDICATIONS:  Current outpatient prescriptions:  .  amLODipine (NORVASC) 10 MG tablet, Take 1 tablet (10 mg total) by mouth daily., Disp: 30 tablet, Rfl: 5 .  clonazePAM (KLONOPIN) 0.5 MG tablet, Take 0.5 mg by mouth at bedtime as needed for anxiety., Disp: , Rfl:  .  magnesium hydroxide (MILK OF MAGNESIA) 400 MG/5ML suspension, Take 5 mLs by mouth daily as needed for mild constipation., Disp: 360 mL, Rfl: 0 .  oxyCODONE (ROXICODONE) 15 MG immediate release tablet, Take 15 mg by mouth every 4 (four) hours as needed for pain., Disp: , Rfl:  .  QUEtiapine (SEROQUEL) 100 MG tablet, Take 100 mg by mouth., Disp: , Rfl:  .  cyclobenzaprine (FLEXERIL) 5 MG tablet, Take 1 tablet (5 mg total) by mouth at bedtime. (Patient not taking: Reported on 12/07/2014), Disp: 30 tablet, Rfl: 5 .  traMADol (ULTRAM) 50 MG tablet, Take  1 tablet (50 mg total) by mouth every 6 (six) hours as needed. (Patient not taking: Reported on 10/05/2014), Disp: 120 tablet, Rfl: 0 .  [DISCONTINUED] DULoxetine (CYMBALTA) 30 MG capsule, Take 30 mg by mouth 2 (two) times daily.  , Disp: , Rfl:  .  [DISCONTINUED] gabapentin (NEURONTIN) 600 MG tablet, Take 600 mg by mouth 3 (three) times daily.  , Disp: , Rfl:  .  [DISCONTINUED] gemfibrozil (LOPID) 600 MG tablet, Take 600 mg by mouth 2 (two) times daily before a meal.  , Disp: , Rfl:  .  [DISCONTINUED] lisinopril  (PRINIVIL,ZESTRIL) 20 MG tablet, Take 20 mg by mouth daily.  , Disp: , Rfl:  .  [DISCONTINUED] venlafaxine (EFFEXOR-XR) 150 MG 24 hr capsule, Take 150 mg by mouth daily.  , Disp: , Rfl:   PAST MEDICAL HISTORY: Past Medical History  Diagnosis Date  . Hypertension   . Arthritis   . Depression   . GERD (gastroesophageal reflux disease)   . Seizure disorder   . H. pylori infection     DX blood test     PAST SURGICAL HISTORY: Past Surgical History  Procedure Laterality Date  . Knee arthroscopy      FAMILY HISTORY: Family History  Problem Relation Age of Onset  . Colon cancer Paternal Aunt     SOCIAL HISTORY:  History   Social History  . Marital Status: Married    Spouse Name: N/A  . Number of Children: 1  . Years of Education: N/A   Occupational History  . Unemployed    Social History Main Topics  . Smoking status: Current Every Day Smoker    Types: Cigarettes  . Smokeless tobacco: Never Used     Comment: Smoking 3-4 cigs/ day  . Alcohol Use: No     Comment: Former  . Drug Use: No  . Sexual Activity: Not on file   Other Topics Concern  . Not on file   Social History Narrative   0 caffeine drinks      PHYSICAL EXAM  Filed Vitals:   12/07/14 1509  BP: 138/92  Pulse: 76  Resp: 14  Height: $Remove'5\' 4"'nsCsoMv$  (1.626 m)  Weight: 154 lb (69.854 kg)    Body mass index is 26.42 kg/(m^2).   General: The patient is well-developed and well-nourished and in no acute distress  Neck:   The neck is mildly tender around C6-C7.  Range of motion of the neck is mildly reduced  Musculoskeletal: He has moderate tenderness over the subacromial bursa at the right right shoulder. range of motion is slightly reduced.  Neurologic Exam  Mental status: The patient is alert and oriented x 3 at the time of the examination. The patient has apparent normal recent and remote memory, with an apparently normal attention span and concentration ability.   Speech is normal.  Cranial nerves:  Extraocular movements are full. Facial strength is normal.  Trapezius and sternocleidomastoid strength is normal. No dysarthria is noted.    Motor:  Muscle bulk,  tone  And strength are normal in arms.   Sensory: Sensory testing is intact to touch in arms.  Gait and station: Station is normal.   Gait is normal. Tandem gait is normal.   Reflexes: Deep tendon reflexes are symmetric and normal bilaterally in arms but brisk in legs with spread at the knees.      DIAGNOSTIC DATA (LABS, IMAGING, TESTING) - I reviewed patient records, labs, notes, testing and imaging myself where available.  Lab  Results  Component Value Date   WBC 9.0 08/30/2014   HGB 14.3 08/30/2014   HCT 42.2 08/30/2014   MCV 85.3 08/30/2014   PLT 358 08/30/2014      Component Value Date/Time   NA 138 08/30/2014 1623   K 4.9 08/30/2014 1623   CL 102 08/30/2014 1623   CO2 26 08/30/2014 1623   GLUCOSE 96 08/30/2014 1623   BUN 10 08/30/2014 1623   CREATININE 0.89 08/30/2014 1623   CREATININE 0.80 10/07/2010 1704   CALCIUM 9.2 08/30/2014 1623   PROT 6.7 08/30/2014 1623   ALBUMIN 4.2 08/30/2014 1623   AST 12 08/30/2014 1623   ALT 9 08/30/2014 1623   ALKPHOS 78 08/30/2014 1623   BILITOT 0.4 08/30/2014 1623   GFRNONAA >89 08/30/2014 1623   GFRNONAA >60 10/29/2008 1548   GFRAA >89 08/30/2014 1623   GFRAA  10/29/2008 1548    >60        The eGFR has been calculated using the MDRD equation. This calculation has not been validated in all clinical situations. eGFR's persistently <60 mL/min signify possible Chronic Kidney Disease.   Lab Results  Component Value Date   CHOL 159 04/15/2014   HDL 38* 04/15/2014   LDLCALC 92 04/15/2014   TRIG 145 04/15/2014   CHOLHDL 4.2 04/15/2014       ASSESSMENT AND PLAN  Neck pain - Plan: MR Cervical Spine Wo Contrast, AMB referral to orthopedics  Right shoulder pain - Plan: MR Cervical Spine Wo Contrast, AMB referral to orthopedics   1.   Due to the neck pain,  shoulder pain and hyperreflexia in the legs, I'll check an MRI of the cervical spine. 2.    He did not get much of a benefit from a subacromial bursa injection. I will have him see orthopedics for further evaluation and treatment. 3.  Return to clinic in 3 months or sooner if there are new or worsening neurologic symptoms.   Akira Adelsberger A. Felecia Shelling, MD, PhD 8/61/6837, 2:90 PM Certified in Neurology, Clinical Neurophysiology, Sleep Medicine, Pain Medicine and Neuroimaging  [facility] [address], [place], [place] [address] [phone]

## 2014-12-14 ENCOUNTER — Ambulatory Visit
Admission: RE | Admit: 2014-12-14 | Discharge: 2014-12-14 | Disposition: A | Discharge status: Home / Self Care | Payer: Medicaid Other | Source: Ambulatory Visit | Attending: Neurology | Admitting: Neurology

## 2014-12-14 ENCOUNTER — Encounter (INDEPENDENT_AMBULATORY_CARE_PROVIDER_SITE_OTHER): Payer: Medicaid Other | Admitting: Diagnostic Neuroimaging

## 2014-12-14 DIAGNOSIS — M542 Cervicalgia: Secondary | ICD-10-CM

## 2014-12-14 DIAGNOSIS — M25511 Pain in right shoulder: Secondary | ICD-10-CM

## 2014-12-22 ENCOUNTER — Telehealth: Payer: Self-pay | Admitting: Diagnostic Neuroimaging

## 2014-12-22 NOTE — Telephone Encounter (Addendum)
Patient called requesting results for MRI. Please call and advise. Patient can be reached at [phone]. Patient requesting MRI scan to be sent to [facility]

## 2014-12-23 NOTE — Telephone Encounter (Signed)
Pt returned phone call, please call and advise

## 2014-12-23 NOTE — Telephone Encounter (Signed)
-----   Message from Asa Lente, MD sent at 12/16/2014  8:27 AM EDT ----- MRI shows herniated disc at C4C5 more to the right.    If not doing any better, we can refer for [facility]

## 2014-12-23 NOTE — Telephone Encounter (Signed)
LMTC./fim 

## 2014-12-23 NOTE — Telephone Encounter (Signed)
I have spoken with Daniel Adams this afternoon and per RAS, advised him that mri of c-spine shows herniated disc at C4-5.  I have offered esi.  He verbalized understanding of same, sts he is seeing [facility] and his pain mx. physicians and would  like their input.  He will pick up copies of his mri on cd, at [facility] for those docs.  He will let me know if he would like esi ordered/fim

## 2015-04-26 ENCOUNTER — Ambulatory Visit: Payer: Medicaid Other | Admitting: Neurology

## 2015-04-27 ENCOUNTER — Ambulatory Visit (INDEPENDENT_AMBULATORY_CARE_PROVIDER_SITE_OTHER): Payer: Medicaid Other | Admitting: Neurology

## 2015-04-27 ENCOUNTER — Encounter: Payer: Self-pay | Admitting: Neurology

## 2015-04-27 VITALS — BP 110/66 | HR 66 | Resp 14 | Ht 64.0 in | Wt 147.8 lb

## 2015-04-27 DIAGNOSIS — G4700 Insomnia, unspecified: Secondary | ICD-10-CM

## 2015-04-27 DIAGNOSIS — M549 Dorsalgia, unspecified: Secondary | ICD-10-CM

## 2015-04-27 DIAGNOSIS — M25511 Pain in right shoulder: Secondary | ICD-10-CM | POA: Diagnosis not present

## 2015-04-27 DIAGNOSIS — M5412 Radiculopathy, cervical region: Secondary | ICD-10-CM | POA: Diagnosis not present

## 2015-04-27 DIAGNOSIS — M542 Cervicalgia: Secondary | ICD-10-CM

## 2015-04-27 DIAGNOSIS — G8929 Other chronic pain: Secondary | ICD-10-CM | POA: Diagnosis not present

## 2015-04-27 MED ORDER — DOXEPIN HCL 10 MG PO CAPS: 10.0000 mg | ORAL_CAPSULE | Freq: Every day | ORAL | Status: DC

## 2015-04-27 NOTE — Progress Notes (Signed)
[facility]  PATIENT: Daniel Adams DOB: [birth year]  REFERRING DOCTOR OR PCP:  Lorayne Marek SOURCE: patient and records  _________________________________   HISTORICAL  CHIEF COMPLAINT:  Chief Complaint  Patient presents with  . Neck Pain    Sts. neck pain is some better.  MRI c-spine in July showed a  herniated disc, and esi was offered, but pt. declined, stating he wanted to get input from his pain mx. physician and [facility].  He is seeing ortho for right shoulder pain./fim    HISTORY OF PRESENT ILLNESS:  Daniel Adams is a 54yo man with neck pain, right shoulder and arm pain.   MRI did confirm multilevel degenerative changes with C4C5 HNP and severe right foraminal narrowing at C4C5 and C5C6 and C6C7  Neck pain is present but better.    Some pain radiates into the shoulder but not the arm. The left arm and shoulder pain has resolvedr.   He denies weakness.   Laying on the right shoulder or raising the right arm increases his pain.  He also has more pain when he looks to the right.   Pain is better laying on left side.   He denies any bladder changes (1 x nocturia).   He notes some constipation.    Lower back pain:    LBP is currnetly worse than neck pain, He has lumbar spinal stenosis and sees Dr. Eulis Manly ([facility]   He takes oxycodone 15 mg tablets for his back. However it has not helped the pain in the neck and the shoulder.    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   He does not recall if gabapentin helped but he thinks he was sleepy during the day on that one and had some GI issues.  REVIEW OF SYSTEMS: Constitutional: No fevers, chills, sweats, or change in appetite.   He has insomnia and feels fatigued Eyes: No visual changes, double vision, eye pain Ear, nose and throat: No hearing loss, ear pain, nasal congestion, sore throat Cardiovascular: No  chest pain, palpitations Respiratory: No shortness of breath at rest or with exertion.   No wheezes.   He has snoring GastrointestinaI: No nausea, vomiting, diarrhea, abdominal pain, fecal incontinence Genitourinary: No dysuria, urinary retention or frequency.  No nocturia. Musculoskeletal: No neck pain, back pain Integumentary: No rash, pruritus, skin lesions Neurological: as above Psychiatric: Notes depressionand anxiety Endocrine: No palpitations, diaphoresis, change in appetite, change in weigh or increased thirst Hematologic/Lymphatic: No anemia, purpura, petechiae. Allergic/Immunologic: No itchy/runny eyes, nasal congestion, recent allergic reactions, rashes  ALLERGIES: Allergies  Allergen Reactions  . Wellbutrin [Bupropion Hcl]     HOME MEDICATIONS:  Current outpatient prescriptions:  .  amLODipine (NORVASC) 10 MG tablet, Take 1 tablet (10 mg total) by mouth daily., Disp: 30 tablet, Rfl: 5 .  clonazePAM (KLONOPIN) 0.5 MG tablet, Take 0.5 mg by mouth at bedtime as needed for anxiety., Disp: , Rfl:  .  cyclobenzaprine (FLEXERIL) 5 MG tablet, Take 1 tablet (5 mg total) by mouth at bedtime., Disp: 30 tablet, Rfl: 5 .  magnesium hydroxide (MILK OF MAGNESIA) 400 MG/5ML suspension, Take 5 mLs by mouth daily as needed for mild constipation., Disp: 360 mL, Rfl: 0 .  oxyCODONE-acetaminophen (PERCOCET) 10-325 MG tablet, Take 1 tablet by mouth every 4 (four) hours as needed for pain., Disp: , Rfl:  .  QUEtiapine (SEROQUEL) 100 MG tablet,  Take 100 mg by mouth., Disp: , Rfl:  .  traMADol (ULTRAM) 50 MG tablet, Take 1 tablet (50 mg total) by mouth every 6 (six) hours as needed. (Patient not taking: Reported on 10/05/2014), Disp: 120 tablet, Rfl: 0 .  [DISCONTINUED] DULoxetine (CYMBALTA) 30 MG capsule, Take 30 mg by mouth 2 (two) times daily.  , Disp: , Rfl:  .  [DISCONTINUED] gabapentin (NEURONTIN) 600 MG tablet, Take 600 mg by mouth 3 (three) times daily.  , Disp: , Rfl:  .  [DISCONTINUED]  gemfibrozil (LOPID) 600 MG tablet, Take 600 mg by mouth 2 (two) times daily before a meal.  , Disp: , Rfl:  .  [DISCONTINUED] lisinopril (PRINIVIL,ZESTRIL) 20 MG tablet, Take 20 mg by mouth daily.  , Disp: , Rfl:  .  [DISCONTINUED] venlafaxine (EFFEXOR-XR) 150 MG 24 hr capsule, Take 150 mg by mouth daily.  , Disp: , Rfl:   PAST MEDICAL HISTORY: Past Medical History  Diagnosis Date  . Hypertension   . Arthritis   . Depression   . GERD (gastroesophageal reflux disease)   . Seizure disorder ([place])   . H. pylori infection     DX blood test     PAST SURGICAL HISTORY: Past Surgical History  Procedure Laterality Date  . Knee arthroscopy      FAMILY HISTORY: Family History  Problem Relation Age of Onset  . Colon cancer Paternal Aunt     SOCIAL HISTORY:  Social History   Social History  . Marital Status: Married    Spouse Name: N/A  . Number of Children: 1  . Years of Education: N/A   Occupational History  . Unemployed    Social History Main Topics  . Smoking status: Current Every Day Smoker    Types: Cigarettes  . Smokeless tobacco: Never Used     Comment: Smoking 3-4 cigs/ day  . Alcohol Use: No     Comment: Former  . Drug Use: No  . Sexual Activity: Not on file   Other Topics Concern  . Not on file   Social History Narrative   0 caffeine drinks      PHYSICAL EXAM  Filed Vitals:   04/27/15 1406  BP: 110/66  Pulse: 66  Resp: 14  Height: $Remove'5\' 4"'rHEyJos$  (1.626 m)  Weight: 147 lb 12.8 oz (67.042 kg)    Body mass index is 25.36 kg/(m^2).   General: The patient is well-developed and well-nourished and in no acute distress  Neck:   The neck is now non-tender.  Range of motion of the neck is mildly reduced.  Shoulder ROM is good.     Musculoskeletal: He has moderate tenderness over the lower lumbar paraspinal muscles Neurologic Exam  Mental status: The patient is alert and oriented x 3 at the time of the examination. The patient has apparent normal recent and  remote memory, with an apparently normal attention span and concentration ability.   Speech is normal.  Cranial nerves: Extraocular movements are full. Facial strength is normal.  Trapezius and sternocleidomastoid strength is normal. No dysarthria is noted.    Motor:  Muscle bulk,  tone  And strength are normal in arms.   Sensory: Sensory testing is intact to touch in arms.  Gait and station: Station is normal.   Gait is normal. Tandem gait is minimally wide.   Reflexes: Deep tendon reflexes are symmetric and normal bilaterally in arms and brisk in legs    DIAGNOSTIC DATA (LABS, IMAGING, TESTING) - I reviewed patient  records, labs, notes, testing and imaging myself where available.  Lab Results  Component Value Date   WBC 9.0 08/30/2014   HGB 14.3 08/30/2014   HCT 42.2 08/30/2014   MCV 85.3 08/30/2014   PLT 358 08/30/2014      Component Value Date/Time   NA 138 08/30/2014 1623   K 4.9 08/30/2014 1623   CL 102 08/30/2014 1623   CO2 26 08/30/2014 1623   GLUCOSE 96 08/30/2014 1623   BUN 10 08/30/2014 1623   CREATININE 0.89 08/30/2014 1623   CREATININE 0.80 10/07/2010 1704   CALCIUM 9.2 08/30/2014 1623   PROT 6.7 08/30/2014 1623   ALBUMIN 4.2 08/30/2014 1623   AST 12 08/30/2014 1623   ALT 9 08/30/2014 1623   ALKPHOS 78 08/30/2014 1623   BILITOT 0.4 08/30/2014 1623   GFRNONAA >89 08/30/2014 1623   GFRNONAA >60 10/29/2008 1548   GFRAA >89 08/30/2014 1623   GFRAA  10/29/2008 1548    >60        The eGFR has been calculated using the MDRD equation. This calculation has not been validated in all clinical situations. eGFR's persistently <60 mL/min signify possible Chronic Kidney Disease.   Lab Results  Component Value Date   CHOL 159 04/15/2014   HDL 38* 04/15/2014   LDLCALC 92 04/15/2014   TRIG 145 04/15/2014   CHOLHDL 4.2 04/15/2014       ASSESSMENT AND PLAN  Neck pain  Chronic back pain  Right shoulder pain  Insomnia  Cervical radiculopathy at  C5   1.   His pain is better. I advised that he tried to do more exercises and stretching. 2.   Low dose doxepin for insomnia. 3.   He will continue to see pain management and orthopedics. He will return as needed.   Richard A. Felecia Shelling, MD, PhD 72/01/1979, 2:21 PM Certified in Neurology, Clinical Neurophysiology, Sleep Medicine, Pain Medicine and Neuroimaging  [facility] [address] [place], [place] [address] [phone]

## 2015-05-05 ENCOUNTER — Encounter: Payer: Self-pay | Admitting: Family Medicine

## 2015-05-05 ENCOUNTER — Ambulatory Visit: Payer: Medicaid Other | Attending: Family Medicine | Admitting: Family Medicine

## 2015-05-05 VITALS — BP 114/70 | HR 72 | Temp 98.2°F | Resp 16 | Ht 62.0 in | Wt 152.0 lb

## 2015-05-05 DIAGNOSIS — F17210 Nicotine dependence, cigarettes, uncomplicated: Secondary | ICD-10-CM | POA: Insufficient documentation

## 2015-05-05 DIAGNOSIS — M549 Dorsalgia, unspecified: Secondary | ICD-10-CM

## 2015-05-05 DIAGNOSIS — M5489 Other dorsalgia: Secondary | ICD-10-CM | POA: Diagnosis not present

## 2015-05-05 DIAGNOSIS — F17200 Nicotine dependence, unspecified, uncomplicated: Secondary | ICD-10-CM | POA: Diagnosis not present

## 2015-05-05 DIAGNOSIS — E785 Hyperlipidemia, unspecified: Secondary | ICD-10-CM | POA: Insufficient documentation

## 2015-05-05 DIAGNOSIS — Z1159 Encounter for screening for other viral diseases: Secondary | ICD-10-CM | POA: Diagnosis not present

## 2015-05-05 DIAGNOSIS — I10 Essential (primary) hypertension: Secondary | ICD-10-CM | POA: Diagnosis not present

## 2015-05-05 DIAGNOSIS — Z114 Encounter for screening for human immunodeficiency virus [HIV]: Secondary | ICD-10-CM | POA: Diagnosis not present

## 2015-05-05 DIAGNOSIS — G8929 Other chronic pain: Secondary | ICD-10-CM

## 2015-05-05 DIAGNOSIS — M546 Pain in thoracic spine: Secondary | ICD-10-CM

## 2015-05-05 LAB — POCT URINALYSIS DIPSTICK
BILIRUBIN UA: NEGATIVE
Glucose, UA: NEGATIVE
Ketones, UA: NEGATIVE
LEUKOCYTES UA: NEGATIVE
Nitrite, UA: NEGATIVE
PROTEIN UA: NEGATIVE
Spec Grav, UA: <=1.005
Urobilinogen, UA: 0.2
pH, UA: 6.5

## 2015-05-05 MED ORDER — AMLODIPINE BESYLATE 10 MG PO TABS: 10.0000 mg | ORAL_TABLET | Freq: Every day | ORAL | Status: DC

## 2015-05-05 NOTE — Progress Notes (Signed)
Subjective:  Patient ID: Daniel Adams, male    DOB: 08/19/1960  Age: 54 y.o. MRN: [id number]  CC: Hypertension   HPI Daniel Adams for    1. CHRONIC HYPERTENSION  Disease Monitoring  Blood pressure range: not checking   Chest pain: no   Dyspnea: no   Claudication: no   Medication compliance: yes  Medication Side Effects  Lightheadedness: no   Urinary frequency: no   Edema: no   Impotence:   2. Chronic mid back pain: under care of pain management. No change in pain. Requesting kidney check.   Social History  Substance Use Topics  . Smoking status: Current Every Day Smoker    Types: Cigarettes  . Smokeless tobacco: Never Used     Comment: Smoking 3-4 cigs/ day  . Alcohol Use: No     Comment: Former    Outpatient Prescriptions Prior to Visit  Medication Sig Dispense Refill  . amLODipine (NORVASC) 10 MG tablet Take 1 tablet (10 mg total) by mouth daily. 30 tablet 5  . clonazePAM (KLONOPIN) 0.5 MG tablet Take 0.5 mg by mouth at bedtime as needed for anxiety.    . magnesium hydroxide (MILK OF MAGNESIA) 400 MG/5ML suspension Take 5 mLs by mouth daily as needed for mild constipation. 360 mL 0  . oxyCODONE-acetaminophen (PERCOCET) 10-325 MG tablet Take 1 tablet by mouth every 4 (four) hours as needed for pain.    Marland Kitchen. QUEtiapine (SEROQUEL) 100 MG tablet Take 100 mg by mouth.    . cyclobenzaprine (FLEXERIL) 5 MG tablet Take 1 tablet (5 mg total) by mouth at bedtime. (Patient not taking: Reported on 05/05/2015) 30 tablet 5  . doxepin (SINEQUAN) 10 MG capsule Take 1 capsule (10 mg total) by mouth at bedtime. (Patient not taking: Reported on 05/05/2015) 30 capsule 11  . traMADol (ULTRAM) 50 MG tablet Take 1 tablet (50 mg total) by mouth every 6 (six) hours as needed. (Patient not taking: Reported on 10/05/2014) 120 tablet 0   No facility-administered medications prior to visit.    ROS Review of Systems  Constitutional: Negative for fever, chills, fatigue and unexpected  weight change.  Eyes: Negative for visual disturbance.  Respiratory: Negative for cough and shortness of breath.   Cardiovascular: Negative for chest pain, palpitations and leg swelling.  Gastrointestinal: Negative for nausea, vomiting, abdominal pain, diarrhea, constipation and blood in stool.  Endocrine: Negative for polydipsia, polyphagia and polyuria.  Musculoskeletal: Positive for back pain. Negative for myalgias, arthralgias, gait problem and neck pain.  Skin: Negative for rash.  Allergic/Immunologic: Negative for immunocompromised state.  Hematological: Negative for adenopathy. Does not bruise/bleed easily.  Psychiatric/Behavioral: Negative for suicidal ideas, sleep disturbance and dysphoric mood. The patient is not nervous/anxious.     Objective:  BP 114/70 mmHg  Pulse 72  Temp(Src) 98.2 F (36.8 C)  Resp 16  Ht 5\' 2"  (1.575 m)  Wt 152 lb (68.947 kg)  BMI 27.79 kg/m2  SpO2 96%  BP/Weight 05/05/2015 04/27/2015 12/07/2014  Systolic BP 114 110 138  Diastolic BP 70 66 92  Wt. (Lbs) 152 147.8 154  BMI 27.79 25.36 26.42   Physical Exam  Constitutional: He appears well-developed and well-nourished. No distress.  HENT:  Head: Normocephalic and atraumatic.  Neck: Normal range of motion. Neck supple.  Cardiovascular: Normal rate, regular rhythm, normal heart sounds and intact distal pulses.   Pulmonary/Chest: Effort normal and breath sounds normal.  Musculoskeletal: He exhibits no edema.  Neurological: He is alert.  Skin: Skin is  warm and dry. No rash noted. No erythema.  Psychiatric: He has a normal mood and affect.    Assessment & Plan:  Sequan was seen today for hypertension.  Diagnoses and all orders for this visit:  Essential hypertension -     amLODipine (NORVASC) 10 MG tablet; Take 1 tablet (10 mg total) by mouth daily. -     CBC; Future -     COMPLETE METABOLIC PANEL WITH GFR; Future  Hyperlipidemia -     Lipid Panel; Future  Chronic mid back pain -      POCT urinalysis dipstick -     Vitamin D, 25-hydroxy; Future  Need for hepatitis C screening test -     Hepatitis C antibody, reflex; Future  Screening for HIV (human immunodeficiency virus) -     HIV antibody (with reflex); Future   Follow-up: No Follow-up on file.   Dessa Phi MD

## 2015-05-05 NOTE — Progress Notes (Signed)
F/UHTN No suicidal thought  No pain today

## 2015-05-05 NOTE — Patient Instructions (Addendum)
Daniel Adams was seen today for hypertension.  Diagnoses and all orders for this visit:  Essential hypertension -     amLODipine (NORVASC) 10 MG tablet; Take 1 tablet (10 mg total) by mouth daily. -     CBC; Future -     COMPLETE METABOLIC PANEL WITH GFR; Future  Hyperlipidemia -     Lipid Panel; Future  Chronic mid back pain -     POCT urinalysis dipstick -     Vitamin D, 25-hydroxy; Future  Need for hepatitis C screening test -     Hepatitis C antibody, reflex; Future  Screening for HIV (human immunodeficiency virus) -     HIV antibody (with reflex); Future   F/u on May 09, 2015 at 2 PM for labs  You will be called with results  F/u with me in 8 weeks for physical   Dr. Armen PickupFunches

## 2015-05-05 NOTE — Assessment & Plan Note (Signed)
A: HTN well controlled Med: compliant P: Continue current regimen  

## 2015-05-06 ENCOUNTER — Telehealth: Payer: Self-pay | Admitting: *Deleted

## 2015-05-06 NOTE — Telephone Encounter (Signed)
Date of birth verified by pt UA results given  Pt verbalized understanding  Advised to schedule lab appointment

## 2015-05-06 NOTE — Telephone Encounter (Signed)
-----   Message from Dessa PhiJosalyn Funches, MD sent at 05/05/2015  5:53 PM EST ----- UA negative  Patient has other labs pending will come in on 05/09/15 for fasting labs

## 2015-05-06 NOTE — Telephone Encounter (Signed)
error 

## 2015-05-19 ENCOUNTER — Ambulatory Visit: Payer: Medicaid Other | Attending: Family Medicine

## 2015-05-19 DIAGNOSIS — G8929 Other chronic pain: Secondary | ICD-10-CM

## 2015-05-19 DIAGNOSIS — Z114 Encounter for screening for human immunodeficiency virus [HIV]: Secondary | ICD-10-CM

## 2015-05-19 DIAGNOSIS — Z1159 Encounter for screening for other viral diseases: Secondary | ICD-10-CM

## 2015-05-19 DIAGNOSIS — M549 Dorsalgia, unspecified: Secondary | ICD-10-CM

## 2015-05-19 DIAGNOSIS — E785 Hyperlipidemia, unspecified: Secondary | ICD-10-CM

## 2015-05-19 DIAGNOSIS — I10 Essential (primary) hypertension: Secondary | ICD-10-CM

## 2015-05-19 DIAGNOSIS — M546 Pain in thoracic spine: Secondary | ICD-10-CM

## 2015-05-19 LAB — CBC
HCT: 46.5 % (ref 39.0–52.0)
Hemoglobin: 15.5 g/dL (ref 13.0–17.0)
MCH: 28.7 pg (ref 26.0–34.0)
MCHC: 33.3 g/dL (ref 30.0–36.0)
MCV: 86 fL (ref 78.0–100.0)
MPV: 9.6 fL (ref 8.6–12.4)
PLATELETS: 389 10*3/uL (ref 150–400)
RBC: 5.41 MIL/uL (ref 4.22–5.81)
RDW: 14.2 % (ref 11.5–15.5)
WBC: 9.9 10*3/uL (ref 4.0–10.5)

## 2015-05-19 LAB — HIV ANTIBODY (ROUTINE TESTING W REFLEX): HIV 1&2 Ab, 4th Generation: NONREACTIVE

## 2015-05-19 LAB — COMPLETE METABOLIC PANEL WITH GFR
ALT: 13 U/L (ref 9–46)
AST: 12 U/L (ref 10–35)
Albumin: 4.3 g/dL (ref 3.6–5.1)
Alkaline Phosphatase: 85 U/L (ref 40–115)
BILIRUBIN TOTAL: 0.4 mg/dL (ref 0.2–1.2)
BUN: 8 mg/dL (ref 7–25)
CALCIUM: 9.7 mg/dL (ref 8.6–10.3)
CHLORIDE: 102 mmol/L (ref 98–110)
CO2: 30 mmol/L (ref 20–31)
Creat: 0.98 mg/dL (ref 0.70–1.33)
GFR, EST NON AFRICAN AMERICAN: 86 mL/min (ref >=60)
Glucose, Bld: 90 mg/dL (ref 65–99)
Potassium: 5.3 mmol/L (ref 3.5–5.3)
Sodium: 140 mmol/L (ref 135–146)
Total Protein: 6.6 g/dL (ref 6.1–8.1)

## 2015-05-19 LAB — LIPID PANEL
CHOL/HDL RATIO: 4.6 ratio (ref <=5.0)
CHOLESTEROL: 237 mg/dL — AB (ref 125–200)
HDL: 52 mg/dL (ref >=40)
LDL Cholesterol: 161 mg/dL — ABNORMAL HIGH (ref <130)
TRIGLYCERIDES: 122 mg/dL (ref <150)
VLDL: 24 mg/dL (ref <30)

## 2015-05-20 ENCOUNTER — Other Ambulatory Visit: Payer: Self-pay | Admitting: Family Medicine

## 2015-05-20 DIAGNOSIS — E559 Vitamin D deficiency, unspecified: Secondary | ICD-10-CM | POA: Insufficient documentation

## 2015-05-20 DIAGNOSIS — E785 Hyperlipidemia, unspecified: Secondary | ICD-10-CM

## 2015-05-20 LAB — VITAMIN D 25 HYDROXY (VIT D DEFICIENCY, FRACTURES): VIT D 25 HYDROXY: 8 ng/mL — AB (ref 30–100)

## 2015-05-20 LAB — HEPATITIS C ANTIBODY: HCV Ab: NEGATIVE

## 2015-05-20 MED ORDER — VITAMIN D (ERGOCALCIFEROL) 1.25 MG (50000 UNIT) PO CAPS: 50000.0000 [IU] | ORAL_CAPSULE | ORAL | Status: DC

## 2015-05-20 MED ORDER — ATORVASTATIN CALCIUM 40 MG PO TABS
40.0000 mg | ORAL_TABLET | Freq: Every day | ORAL | Status: DC
Start: 2015-05-20 — End: 2015-10-07

## 2015-05-20 NOTE — Addendum Note (Signed)
Addended by: Dessa PhiFUNCHES, Dontaye Hur on: 05/20/2015 09:13 AM   Modules accepted: Orders

## 2015-05-25 ENCOUNTER — Telehealth: Payer: Self-pay | Admitting: *Deleted

## 2015-05-25 NOTE — Telephone Encounter (Signed)
-----   Message from Dessa PhiJosalyn Funches, MD sent at 05/20/2015  9:11 AM EST ----- Vit D def will replace Cholesterol elevated patient at high risk for heart disease and stroke given HTN and smoking. lipitor 40 mg daily recommended or ordered to lower risk. Also smoking cessation.  Screening hep C and HIV negative CBC, CMP normal

## 2015-05-25 NOTE — Telephone Encounter (Signed)
Date of birth verified by pt  Lab result given  Vit D low, cholesterol elevated  Normal CBC CMP negative Hep C HIV Rx send to pharmacy of choice  Pt verbalized understanding

## 2015-06-23 ENCOUNTER — Encounter: Payer: Self-pay | Admitting: Family Medicine

## 2015-06-23 DIAGNOSIS — M549 Dorsalgia, unspecified: Secondary | ICD-10-CM

## 2015-06-23 DIAGNOSIS — G8929 Other chronic pain: Secondary | ICD-10-CM

## 2015-06-23 DIAGNOSIS — M546 Pain in thoracic spine: Principal | ICD-10-CM

## 2015-06-23 NOTE — Progress Notes (Signed)
Reviewed records for [facility], last OV 06/21/2015.  Patient non-compliant with instructions to take oxymorphone three times a day,  Overuse of oxycodone. + of benzoylecgonine, cocaine metabolite Admitted to [facility] use x 2 at [facility] party. Denies cocaine use.   Declined R lumbar facet injections  Declined tramadol taper Prescribed clonidine 0.2 mg/24 hr transdermal patch  Expressed desire to not continue with plan of care

## 2015-06-24 ENCOUNTER — Encounter (HOSPITAL_COMMUNITY): Payer: Self-pay | Admitting: *Deleted

## 2015-06-24 ENCOUNTER — Emergency Department (HOSPITAL_COMMUNITY)
Admission: EM | Admit: 2015-06-24 | Discharge: 2015-06-24 | Disposition: A | Discharge status: Home / Self Care | Payer: Medicaid Other | Source: Home / Self Care | Attending: Family Medicine | Admitting: Family Medicine

## 2015-06-24 DIAGNOSIS — M545 Low back pain, unspecified: Secondary | ICD-10-CM

## 2015-06-24 DIAGNOSIS — G8929 Other chronic pain: Secondary | ICD-10-CM

## 2015-06-24 MED ORDER — OXYCODONE HCL 15 MG PO TABS: 15.0000 mg | ORAL_TABLET | ORAL | Status: DC | PRN

## 2015-06-24 NOTE — ED Provider Notes (Signed)
CSN: [id number]     Arrival date & time 06/24/15  1410 History   First MD Initiated Contact with Patient 06/24/15 1545     Chief Complaint  Patient presents with  . Back Pain   (Consider location/radiation/quality/duration/timing/severity/associated sxs/prior Treatment) Patient is a 55 y.o. male presenting with back pain. The history is provided by the patient and the spouse.  Back Pain Location:  Lumbar spine Quality:  Shooting Radiates to:  L posterior upper leg Pain severity:  Moderate Chronicity:  Chronic Context comment:  3 days without oxy since he left pain clinic. to see lmd 2/6, requesting help with meds until then. Associated symptoms: no abdominal pain, no dysuria, no fever, no numbness and no tingling     Past Medical History  Diagnosis Date  . Hypertension   . Arthritis   . Depression   . GERD (gastroesophageal reflux disease)   . Seizure disorder (HCC)   . H. pylori infection     DX blood test    Past Surgical History  Procedure Laterality Date  . Knee arthroscopy     Family History  Problem Relation Age of Onset  . Colon cancer Paternal Aunt    Social History  Substance Use Topics  . Smoking status: Current Every Day Smoker    Types: Cigarettes  . Smokeless tobacco: Never Used     Comment: Smoking 3-4 cigs/ day  . Alcohol Use: No     Comment: Former    Review of Systems  Constitutional: Negative.  Negative for fever.  Gastrointestinal: Negative for abdominal pain.  Genitourinary: Negative for dysuria.  Musculoskeletal: Positive for back pain. Negative for joint swelling and gait problem.  Skin: Negative.   Neurological: Negative for tingling and numbness.  All other systems reviewed and are negative.   Allergies  Wellbutrin  Home Medications   Prior to Admission medications   Medication Sig Start Date End Date Taking? Authorizing Provider  oxyCODONE (ROXICODONE) 15 MG immediate release tablet Take 15 mg by mouth 5 (five) times daily as  needed for pain.   Yes Historical Provider, MD  amLODipine (NORVASC) 10 MG tablet Take 1 tablet (10 mg total) by mouth daily. 05/05/15   Josalyn Funches, MD  atorvastatin (LIPITOR) 40 MG tablet Take 1 tablet (40 mg total) by mouth daily. 05/20/15   Josalyn Funches, MD  escitalopram (LEXAPRO) 20 MG tablet Take 20 mg by mouth daily.    Historical Provider, MD  hydrOXYzine (VISTARIL) 50 MG capsule Take 50 mg by mouth 3 (three) times daily as needed.    Historical Provider, MD  magnesium hydroxide (MILK OF MAGNESIA) 400 MG/5ML suspension Take 5 mLs by mouth daily as needed for mild constipation. 04/13/14   Doris Cheadle, MD  naloxegol oxalate (MOVANTIK) 12.5 MG TABS tablet Take 25 mg by mouth daily.    Historical Provider, MD  QUEtiapine (SEROQUEL) 100 MG tablet Take 100 mg by mouth.    Historical Provider, MD  Vitamin D, Ergocalciferol, (DRISDOL) 50000 UNITS CAPS capsule Take 1 capsule (50,000 Units total) by mouth every 7 (seven) days. For 8 weeks 05/20/15   Dessa Phi, MD   Meds Ordered and Administered this Visit  Medications - No data to display  BP 154/76 mmHg  Pulse 74  Temp(Src) 97.9 F (36.6 C) (Oral)  Resp 20  SpO2 100% No data found.   Physical Exam  Constitutional: He is oriented to person, place, and time. He appears well-developed and well-nourished. No distress.  Musculoskeletal: Normal range of  motion. He exhibits no tenderness.  Neurological: He is alert and oriented to person, place, and time.  Skin: Skin is warm and dry.  Nursing note and vitals reviewed.   ED Course  Procedures (including critical care time)  Labs Review Labs Reviewed - No data to display  Imaging Review No results found.   Visual Acuity Review  Right Eye Distance:   Left Eye Distance:   Bilateral Distance:    Right Eye Near:   Left Eye Near:    Bilateral Near:         MDM  No diagnosis found.  Meds ordered this encounter  Medications  . oxyCODONE (ROXICODONE) 15 MG  immediate release tablet    Sig: Take 1 tablet (15 mg total) by mouth every 4 (four) hours as needed for pain.    Dispense:  60 tablet    Refill:  0     Linna Hoff, MD 06/24/15 [phone]

## 2015-06-24 NOTE — Discharge Instructions (Signed)
We will not refill this pain medicine anymore. You must get refills from pain medicine or your doctor.

## 2015-06-24 NOTE — ED Notes (Signed)
Pt  Reports  Symptoms  Of  Back  Pain      -  He  Reports  The  Pain is  Chronic     He     Was   Going  To  A  Pain  Control   Clinic          He  Reports   He  Did  Not  Get  His  Pain meds       And   He       Will  No longer    Be     Going    To  That  Office   He  States  He  Is  Feeling  anxious

## 2015-06-27 ENCOUNTER — Ambulatory Visit: Payer: Medicaid Other | Attending: Family Medicine | Admitting: Family Medicine

## 2015-06-27 ENCOUNTER — Encounter: Payer: Self-pay | Admitting: Family Medicine

## 2015-06-27 VITALS — BP 120/81 | HR 71 | Temp 98.3°F | Resp 16 | Ht 62.0 in | Wt 149.0 lb

## 2015-06-27 DIAGNOSIS — F418 Other specified anxiety disorders: Secondary | ICD-10-CM | POA: Diagnosis not present

## 2015-06-27 DIAGNOSIS — Z0000 Encounter for general adult medical examination without abnormal findings: Secondary | ICD-10-CM | POA: Diagnosis not present

## 2015-06-27 DIAGNOSIS — M546 Pain in thoracic spine: Secondary | ICD-10-CM

## 2015-06-27 DIAGNOSIS — M545 Low back pain, unspecified: Secondary | ICD-10-CM

## 2015-06-27 DIAGNOSIS — M5440 Lumbago with sciatica, unspecified side: Secondary | ICD-10-CM

## 2015-06-27 DIAGNOSIS — M5489 Other dorsalgia: Secondary | ICD-10-CM | POA: Diagnosis not present

## 2015-06-27 DIAGNOSIS — M549 Dorsalgia, unspecified: Secondary | ICD-10-CM

## 2015-06-27 DIAGNOSIS — M542 Cervicalgia: Secondary | ICD-10-CM | POA: Diagnosis not present

## 2015-06-27 DIAGNOSIS — G8929 Other chronic pain: Secondary | ICD-10-CM | POA: Insufficient documentation

## 2015-06-27 LAB — POCT GLYCOSYLATED HEMOGLOBIN (HGB A1C): Hemoglobin A1C: 5.6

## 2015-06-27 MED ORDER — TRAMADOL HCL 50 MG PO TABS: 50.0000 mg | ORAL_TABLET | Freq: Three times a day (TID) | ORAL | Status: DC | PRN

## 2015-06-27 MED ORDER — ESCITALOPRAM OXALATE 20 MG PO TABS: 20.0000 mg | ORAL_TABLET | Freq: Every day | ORAL | Status: DC

## 2015-06-27 NOTE — Progress Notes (Signed)
chw

## 2015-06-27 NOTE — Progress Notes (Signed)
Subjective:  Patient ID: Daniel Adams, male    DOB: 1960/09/25  Age: 55 y.o. MRN: [id number]  CC: Pain   HPI Lance Huaracha presents for    1. Chronic neck and back pain: for many years. [facility] at [facility] until most recent visit.   Here is the summary of that last visit. Reviewed records for [facility], last OV 06/21/2015.  Patient non-compliant with instructions to take oxymorphone three times a day,  Overuse of oxycodone. + of benzoylecgonine, cocaine metabolite Admitted to [facility] use x 2 at [facility] party. Denies cocaine use.   Declined R lumbar facet injections  Declined tramadol taper Prescribed clonidine 0.2 mg/24 hr transdermal patch  Expressed desire to not continue with plan of care  Today he reports pain in back which is mostly R sided and in neck. He admits to the cocaine use and reports it is not an ongoing issue. He request referral to new pain management clinic, requesting [facility]. He request oxycodone refill.   2. Depression: patient reports that he is not taking lexapro. He is amenable to starting it. He denies suicidal ideation.   Social History  Substance Use Topics  . Smoking status: Current Every Day Smoker    Types: Cigarettes  . Smokeless tobacco: Never Used     Comment: Smoking 3-4 cigs/ day  . Alcohol Use: No     Comment: Former    Outpatient Prescriptions Prior to Visit  Medication Sig Dispense Refill  . amLODipine (NORVASC) 10 MG tablet Take 1 tablet (10 mg total) by mouth daily. 30 tablet 5  . atorvastatin (LIPITOR) 40 MG tablet Take 1 tablet (40 mg total) by mouth daily. 90 tablet 3  . escitalopram (LEXAPRO) 20 MG tablet Take 20 mg by mouth daily.    . hydrOXYzine (VISTARIL) 50 MG capsule Take 50 mg by mouth 3 (three) times daily as needed.    . magnesium hydroxide (MILK OF MAGNESIA) 400 MG/5ML suspension Take 5 mLs by mouth daily as needed for mild constipation. 360 mL 0  . naloxegol oxalate  (MOVANTIK) 12.5 MG TABS tablet Take 25 mg by mouth daily.    Marland Kitchen oxyCODONE (ROXICODONE) 15 MG immediate release tablet Take 1 tablet (15 mg total) by mouth every 4 (four) hours as needed for pain. 60 tablet 0  . QUEtiapine (SEROQUEL) 100 MG tablet Take 100 mg by mouth.    . Vitamin D, Ergocalciferol, (DRISDOL) 50000 UNITS CAPS capsule Take 1 capsule (50,000 Units total) by mouth every 7 (seven) days. For 8 weeks 8 capsule 0   No facility-administered medications prior to visit.    ROS Review of Systems  Constitutional: Negative for fever, chills, fatigue and unexpected weight change.  Eyes: Negative for visual disturbance.  Respiratory: Negative for cough and shortness of breath.   Cardiovascular: Negative for chest pain, palpitations and leg swelling.  Gastrointestinal: Negative for nausea, vomiting, abdominal pain, diarrhea, constipation and blood in stool.  Endocrine: Negative for polydipsia, polyphagia and polyuria.  Musculoskeletal: Positive for back pain, arthralgias, gait problem and neck pain. Negative for myalgias.  Skin: Negative for rash.  Allergic/Immunologic: Negative for immunocompromised state.  Hematological: Negative for adenopathy. Does not bruise/bleed easily.  Psychiatric/Behavioral: Positive for suicidal ideas and dysphoric mood. Negative for sleep disturbance. The patient is nervous/anxious.     Objective:  BP 120/81 mmHg  Pulse 71  Temp(Src) 98.3 F (36.8 C) (Oral)  Resp 16  Ht  (1.575 m)  Wt  149 lb (67.586 kg)  BMI 27.25 kg/m2  SpO2 98%  BP/Weight 06/27/2015 06/24/2015 05/05/2015  Systolic BP 120 154 114  Diastolic BP 81 76 70  Wt. (Lbs) 149 - 152  BMI 27.25 - 27.79   Physical Exam  Constitutional: He appears well-developed and well-nourished. No distress.  HENT:  Head: Normocephalic and atraumatic.  Neck: Normal range of motion. Neck supple.  Pulmonary/Chest: Effort normal.  Musculoskeletal: He exhibits no edema.  Neurological: He is alert.    Skin: Skin is warm and dry. No rash noted. No erythema.  Psychiatric: He has a normal mood and affect.   Lab Results  Component Value Date   HGBA1C 5.60 06/27/2015   Depression screen [facility] 2/9 06/27/2015 05/05/2015 05/05/2015  Decreased Interest 3 0 0  Down, Depressed, Hopeless 3 0 0  PHQ - 2 Score 6 0 0  Altered sleeping 3 0 -  Tired, decreased energy 3 1 -  Change in appetite 3 0 -  Feeling bad or failure about yourself  - 0 -  Trouble concentrating 2 - -  Moving slowly or fidgety/restless 2 0 -  Suicidal thoughts 0 0 -  PHQ-9 Score 19 1 -  Difficult doing work/chores - Not difficult at all -    GAD 7 : Generalized Anxiety Score 06/27/2015 05/05/2015  Nervous, Anxious, on Edge 3 1  Control/stop worrying 3 0  Worry too much - different things 3 0  Trouble relaxing 3 3  Restless 1 1  Easily annoyed or irritable 3 0  Afraid - awful might happen 3 0  Total GAD 7 Score 19 5    Assessment & Plan:   Kamar was seen today for pain.  Diagnoses and all orders for this visit:  Healthcare maintenance -     HgB A1c  Chronic mid back pain -     Cancel: Ambulatory referral to Pain Clinic  Depression with anxiety -     escitalopram (LEXAPRO) 20 MG tablet; Take 1 tablet (20 mg total) by mouth daily. Reported on 06/27/2015  Chronic cervical pain -     traMADol (ULTRAM) 50 MG tablet; Take 1 tablet (50 mg total) by mouth every 8 (eight) hours as needed. -     Ambulatory referral to Pain Clinic  Chronic lumbar pain -     traMADol (ULTRAM) 50 MG tablet; Take 1 tablet (50 mg total) by mouth every 8 (eight) hours as needed. -     Ambulatory referral to Pain Clinic    No orders of the defined types were placed in this encounter.    Follow-up: No Follow-up on file.   Dessa Phi MD

## 2015-06-27 NOTE — Assessment & Plan Note (Signed)
Chronic neck and back pain. Patient previously on oxycodone and hydromorphone for pain management.   Plan: Patient informed that per clinic policy, tramadol or tylenol #3 will be prescribed He would like to try tramadol  Tramadol ordered Pain management referral placed  

## 2015-06-27 NOTE — Assessment & Plan Note (Signed)
Chronic depression in chronic pain lexapro re-ordered

## 2015-06-27 NOTE — Progress Notes (Signed)
Referral to pain management  Pain low left back and mid back  Pain scale #9  Tobacco user 3-4 cigarette per day  No suicidal thoughts in the past two weeks

## 2015-06-27 NOTE — Patient Instructions (Signed)
Daniel Adams was seen today for pain.  Diagnoses and all orders for this visit:  Healthcare maintenance -     HgB A1c  Chronic mid back pain -     Cancel: Ambulatory referral to Pain Clinic  Depression with anxiety -     escitalopram (LEXAPRO) 20 MG tablet; Take 1 tablet (20 mg total) by mouth daily. Reported on 06/27/2015  Chronic cervical pain -     traMADol (ULTRAM) 50 MG tablet; Take 1 tablet (50 mg total) by mouth every 8 (eight) hours as needed. -     Ambulatory referral to Pain Clinic  Chronic lumbar pain -     traMADol (ULTRAM) 50 MG tablet; Take 1 tablet (50 mg total) by mouth every 8 (eight) hours as needed. -     Ambulatory referral to Pain Clinic    F/u in 4-6 weeks for depression since restarting lexapro  Dr. Armen Pickup

## 2015-06-27 NOTE — Assessment & Plan Note (Signed)
Chronic neck and back pain. Patient previously on oxycodone and hydromorphone for pain management.   Plan: Patient informed that per clinic policy, tramadol or tylenol #3 will be prescribed He would like to try tramadol  Tramadol ordered Pain management referral placed

## 2015-07-04 ENCOUNTER — Ambulatory Visit: Payer: Medicaid Other | Admitting: Family Medicine

## 2015-07-11 IMAGING — CT CT CHEST W/O CM
2 of 3 series · 16 of 36 positions shown, 20 images · non-contrast
Comparison: None.

CLINICAL DATA: History of lung nodule

EXAM:
CT CHEST WITHOUT CONTRAST
TECHNIQUE: Multidetector CT imaging of the chest was performed following the
standard protocol without IV contrast..

[Series 2: thorax 5.0 i31f 1 · axial · 0.76mm/px · z∈[+1161,+1446]mm · 13 of 65 slices shown, 17 images]
[im 5/65  mediastinal]
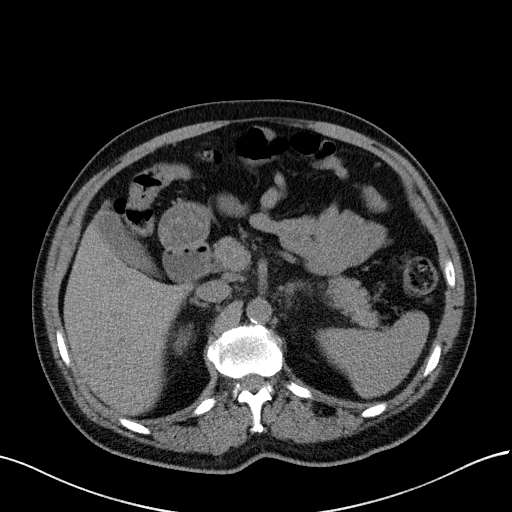
[im 5/65  lung]
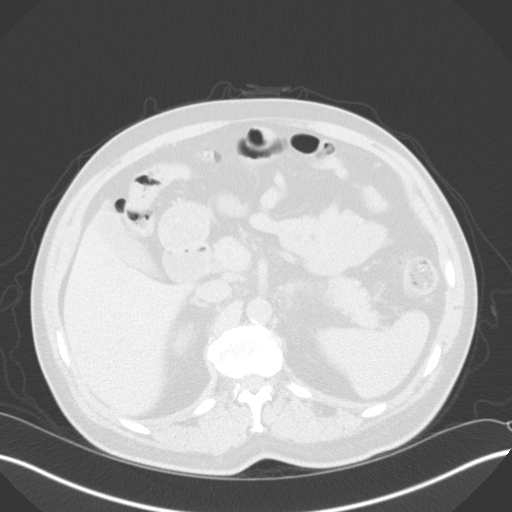
[im 10/65  lung]
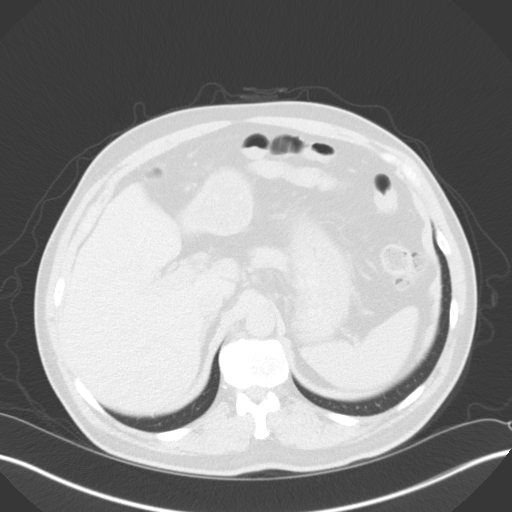
[im 15/65  lung]
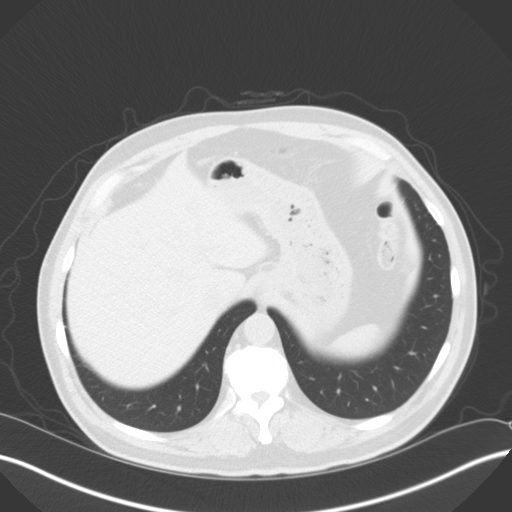
[im 19/65  lung]
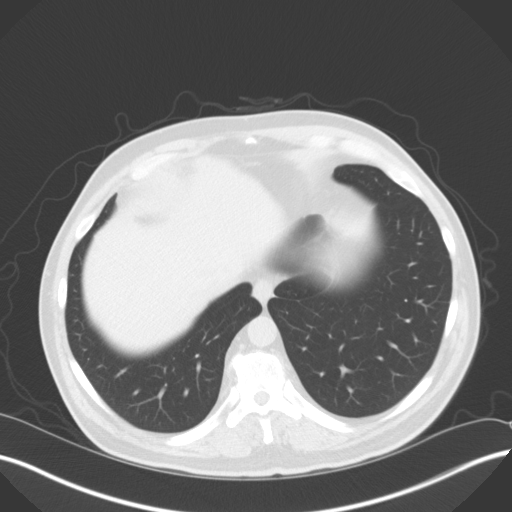
[im 24/65  mediastinal]
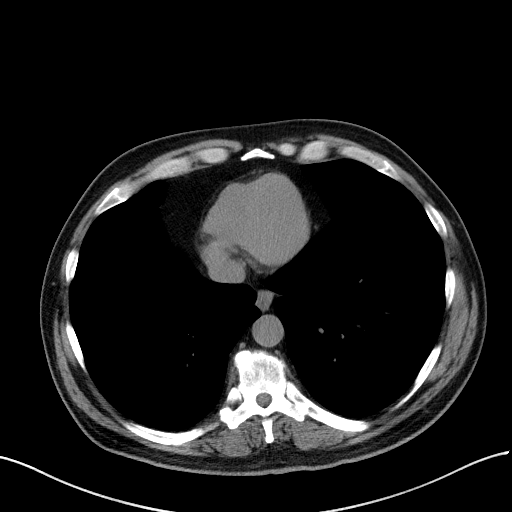
[im 24/65  lung]
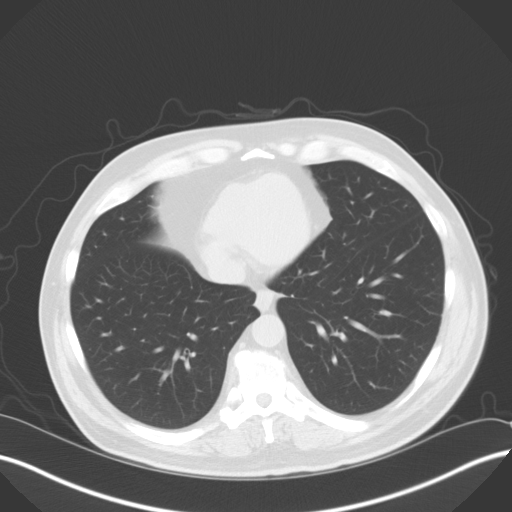
[im 29/65  lung]
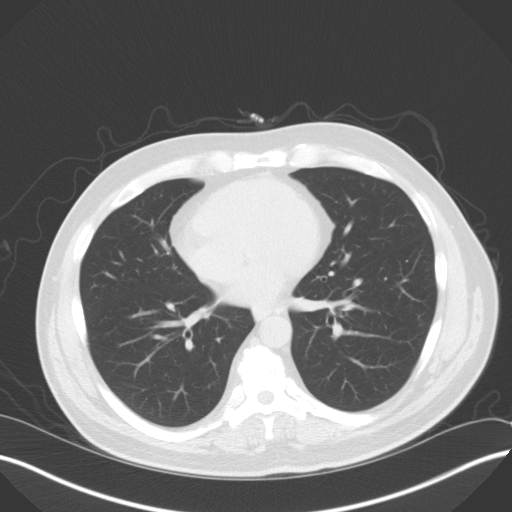
[im 34/65  lung]
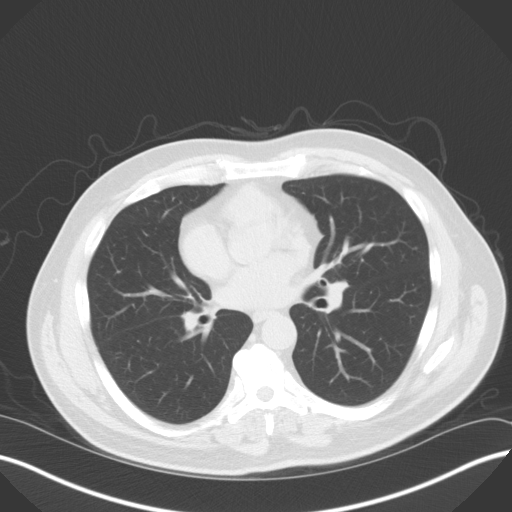
[im 38/65  lung]
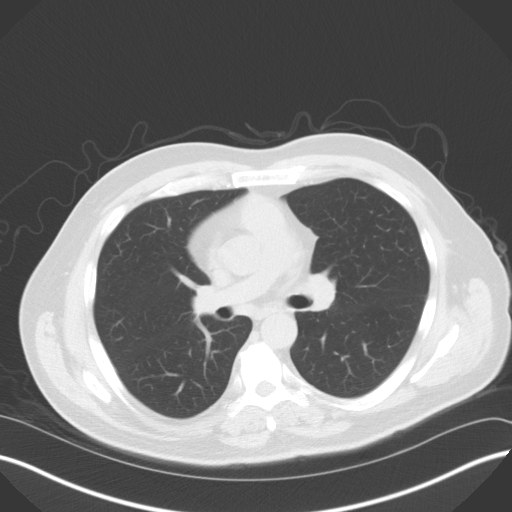
[im 43/65  mediastinal]
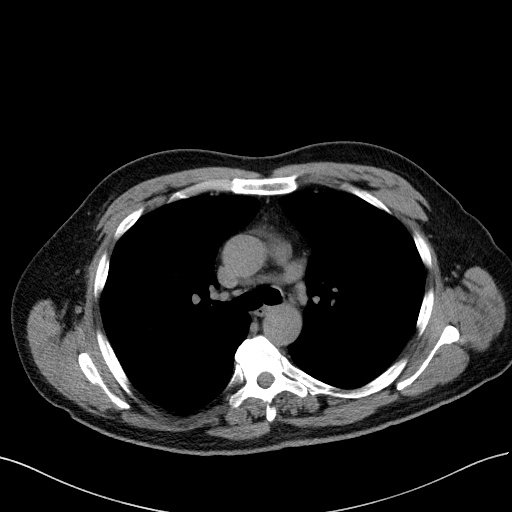
[im 43/65  lung]
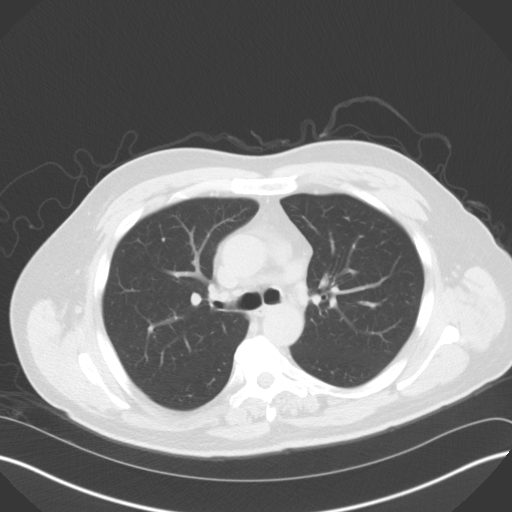
[im 48/65  lung]
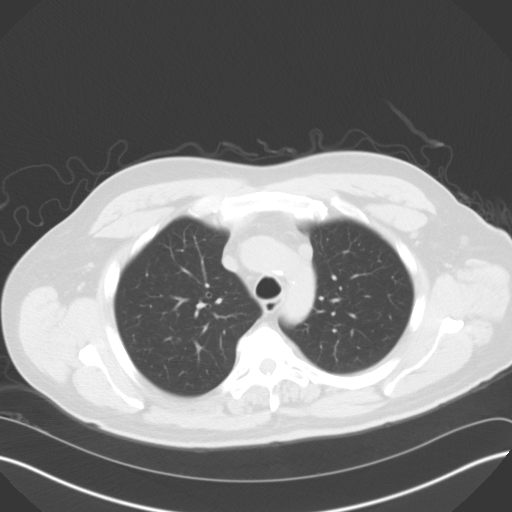
[im 53/65  lung]
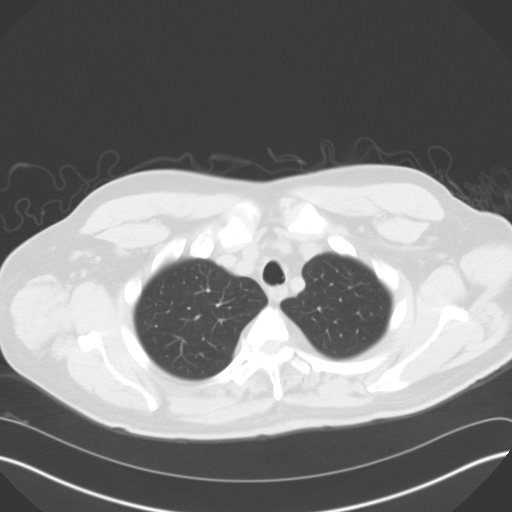
[im 57/65  lung]
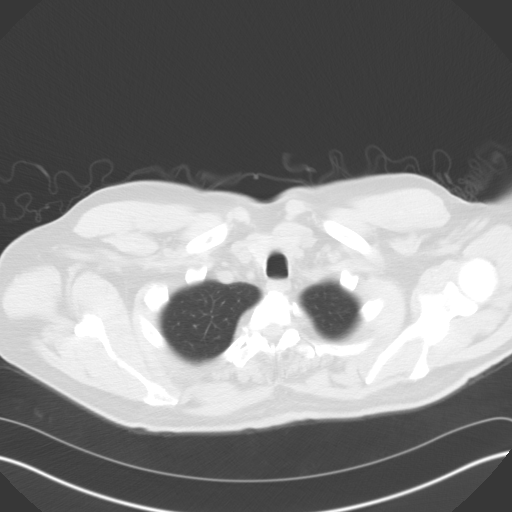
[im 62/65  mediastinal]
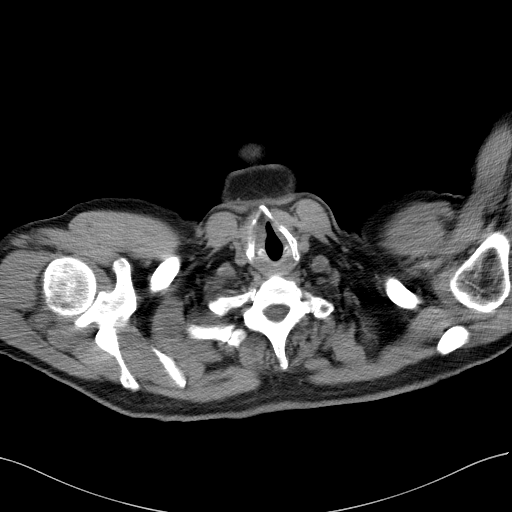
[im 62/65  lung]
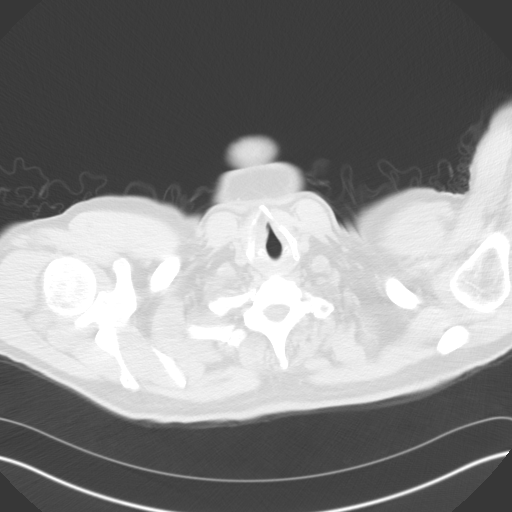

[Series 5: coronal · coronal · 0.69mm/px · 3 of 81 slices shown]
[im 17/81  lung]
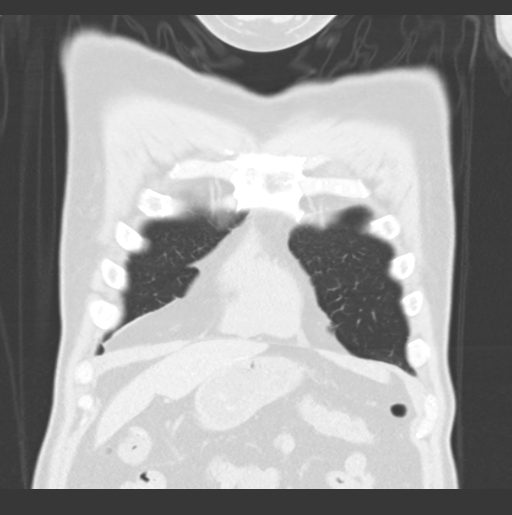
[im 33/81  lung]
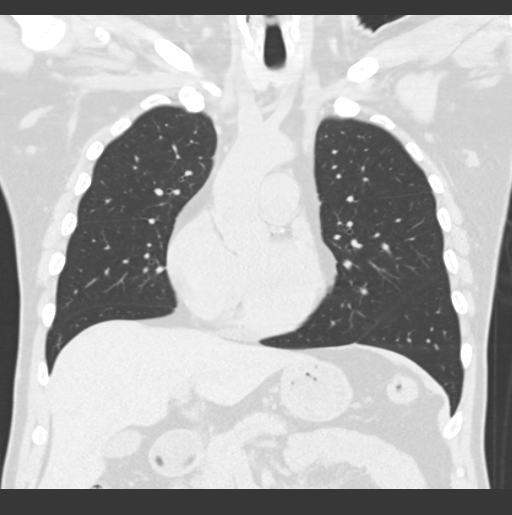
[im 49/81  lung]
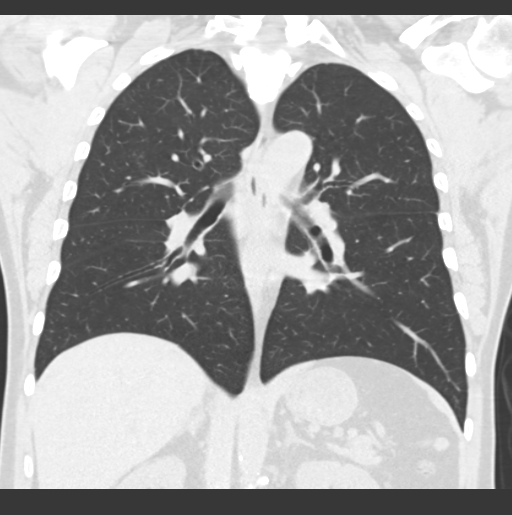

[16 of 36 positions shown; findings below may reference images not displayed]

FINDINGS: No hilar or mediastinal adenopathy. Mild calcification of the
thoracic aorta. No pleural or pericardial effusion. No abnormal
pulmonary parenchymal opacity. No acute musculoskeletal findings.
Images through the upper abdomen show no acute abnormalities.
IMPRESSION: No significant abnormalities

## 2015-07-18 ENCOUNTER — Encounter: Payer: Self-pay | Admitting: Family Medicine

## 2015-07-18 ENCOUNTER — Encounter: Payer: Self-pay | Admitting: Clinical

## 2015-07-18 ENCOUNTER — Ambulatory Visit: Payer: Medicaid Other | Attending: Family Medicine | Admitting: Family Medicine

## 2015-07-18 VITALS — BP 129/83 | HR 98 | Temp 97.7°F | Resp 16 | Ht 62.0 in | Wt 142.0 lb

## 2015-07-18 DIAGNOSIS — Z79899 Other long term (current) drug therapy: Secondary | ICD-10-CM | POA: Insufficient documentation

## 2015-07-18 DIAGNOSIS — F1290 Cannabis use, unspecified, uncomplicated: Secondary | ICD-10-CM | POA: Diagnosis not present

## 2015-07-18 DIAGNOSIS — M542 Cervicalgia: Secondary | ICD-10-CM | POA: Insufficient documentation

## 2015-07-18 DIAGNOSIS — G8929 Other chronic pain: Secondary | ICD-10-CM | POA: Diagnosis not present

## 2015-07-18 DIAGNOSIS — M545 Low back pain: Secondary | ICD-10-CM | POA: Diagnosis present

## 2015-07-18 DIAGNOSIS — F17210 Nicotine dependence, cigarettes, uncomplicated: Secondary | ICD-10-CM | POA: Diagnosis not present

## 2015-07-18 DIAGNOSIS — M549 Dorsalgia, unspecified: Secondary | ICD-10-CM | POA: Insufficient documentation

## 2015-07-18 DIAGNOSIS — F329 Major depressive disorder, single episode, unspecified: Secondary | ICD-10-CM | POA: Insufficient documentation

## 2015-07-18 DIAGNOSIS — F419 Anxiety disorder, unspecified: Secondary | ICD-10-CM | POA: Diagnosis not present

## 2015-07-18 MED ORDER — TRAMADOL HCL 50 MG PO TABS: 50.0000 mg | ORAL_TABLET | Freq: Three times a day (TID) | ORAL | Status: DC | PRN

## 2015-07-18 MED ORDER — ACETAMINOPHEN-CODEINE #3 300-30 MG PO TABS: 1.0000 | ORAL_TABLET | Freq: Three times a day (TID) | ORAL | Status: DC | PRN

## 2015-07-18 NOTE — Assessment & Plan Note (Signed)
A: chronic pain P; Referral to neurosurgery Tramadol refilled, due 2/30/2017 Tylenol #3 added for pain contr

## 2015-07-18 NOTE — Progress Notes (Unsigned)
Depression screen [facility] 2/9 07/18/2015 06/27/2015 05/05/2015 05/05/2015 05/05/2015  Decreased Interest 0 3 0 0 0  Down, Depressed, Hopeless 3 3 0 0 0  PHQ - 2 Score 3 6 0 0 0  Altered sleeping 3 3 0 - -  Tired, decreased energy - -  Change in appetite - 3 0 - -  Feeling bad or failure about yourself  3 - 0 - -  Trouble concentrating 1 2 - - -  Moving slowly or fidgety/restless 3 2 0 - -  Suicidal thoughts 0 0 0 - -  PHQ-9 Score - -  Difficult doing work/chores - - Not difficult at all - -   GAD 7 : Generalized Anxiety Score 07/18/2015 06/27/2015 05/05/2015  Nervous, Anxious, on Edge Control/stop worrying 3 3 0  Worry too much - different things 3 3 0  Trouble relaxing Restless Easily annoyed or irritable 3 3 0  Afraid - awful might happen 1 3 0  Total GAD 7 Score 19 19 5

## 2015-07-18 NOTE — Progress Notes (Signed)
Patient ID: Daniel Adams, male   DOB: 1960/11/18, 55 y.o.   MRN: [id number]   Subjective:  Patient ID: Daniel Adams, male    DOB: [birth year]  Age: 55 y.o. MRN: [id number]  CC: Referral   HPI Daniel Adams presents for    1. Chronic neck and back pain: for many years. Pain [facility] at [facility] until most recent visit.   Here is the summary of that last visit. Reviewed records for [facility], last OV 06/21/2015.  Patient non-compliant with instructions to take oxymorphone three times a day,  Overuse of oxycodone. + of benzoylecgonine, cocaine metabolite Admitted to [facility] use x 2 at [facility] party. Denies cocaine use.   Declined R lumbar facet injections  Declined tramadol taper Prescribed clonidine 0.2 mg/24 hr transdermal patch  Expressed desire to not continue with plan of care  Today he reports pain in back. Pain is 9/10 still. He is nearly out of tramadol. He is requesting refill. He request referral to [facility].   2. Depression: patient reports that he is now taking lexapro. He still has significant depression and anxiety symptoms.   Social History  Substance Use Topics  . Smoking status: Current Every Day Smoker    Types: Cigarettes  . Smokeless tobacco: Never Used     Comment: Smoking 3-4 cigs/ day  . Alcohol Use: No     Comment: Former    Outpatient Prescriptions Prior to Visit  Medication Sig Dispense Refill  . amLODipine (NORVASC) 10 MG tablet Take 1 tablet (10 mg total) by mouth daily. 30 tablet 5  . atorvastatin (LIPITOR) 40 MG tablet Take 1 tablet (40 mg total) by mouth daily. 90 tablet 3  . escitalopram (LEXAPRO) 20 MG tablet Take 1 tablet (20 mg total) by mouth daily. Reported on 06/27/2015 30 tablet 5  . hydrOXYzine (VISTARIL) 50 MG capsule Take 50 mg by mouth 3 (three) times daily as needed.    . magnesium hydroxide (MILK OF MAGNESIA) 400 MG/5ML suspension Take 5 mLs by mouth daily as needed for  mild constipation. 360 mL 0  . naloxegol oxalate (MOVANTIK) 12.5 MG TABS tablet Take 25 mg by mouth daily.    . QUEtiapine (SEROQUEL) 100 MG tablet Take 100 mg by mouth.    . traMADol (ULTRAM) 50 MG tablet Take 1 tablet (50 mg total) by mouth every 8 (eight) hours as needed. 90 tablet 0  . Vitamin D, Ergocalciferol, (DRISDOL) 50000 UNITS CAPS capsule Take 1 capsule (50,000 Units total) by mouth every 7 (seven) days. For 8 weeks (Patient not taking: Reported on 07/18/2015) 8 capsule 0   No facility-administered medications prior to visit.    ROS Review of Systems  Constitutional: Negative for fever, chills, fatigue and unexpected weight change.  Eyes: Negative for visual disturbance.  Respiratory: Negative for cough and shortness of breath.   Cardiovascular: Negative for chest pain, palpitations and leg swelling.  Gastrointestinal: Negative for nausea, vomiting, abdominal pain, diarrhea, constipation and blood in stool.  Endocrine: Negative for polydipsia, polyphagia and polyuria.  Musculoskeletal: Positive for back pain, arthralgias, gait problem and neck pain. Negative for myalgias.  Skin: Negative for rash.  Allergic/Immunologic: Negative for immunocompromised state.  Hematological: Negative for adenopathy. Does not bruise/bleed easily.  Psychiatric/Behavioral: Positive for dysphoric mood. Negative for suicidal ideas and sleep disturbance. The patient is nervous/anxious.     Objective:  BP 129/83 mmHg  Pulse 98  Temp(Src) 97.7 F (36.5 C) (Rectal)  Resp  16  Ht  (1.575 m)  Wt 142 lb (64.411 kg)  BMI 25.97 kg/m2  SpO2 97%  BP/Weight 07/18/2015 06/27/2015 06/24/2015  Systolic BP 129 120 154  Diastolic BP 83 81 76  Wt. (Lbs) 142 149 -  BMI 25.97 27.25 -   Physical Exam  Constitutional: He appears well-developed and well-nourished. No distress.  HENT:  Head: Normocephalic and atraumatic.  Neck: Normal range of motion. Neck supple.  Pulmonary/Chest: Effort normal.    Musculoskeletal: He exhibits no edema.  Neurological: He is alert.  Skin: Skin is warm and dry. No rash noted. No erythema.  Psychiatric: He has a normal mood and affect.   Lab Results  Component Value Date   HGBA1C 5.60 06/27/2015   Depression screen [facility] 2/9 07/18/2015 06/27/2015 05/05/2015  Decreased Interest 0 3 0  Down, Depressed, Hopeless 3 3 0  PHQ - 2 Score 3 6 0  Altered sleeping 3 3 0  Tired, decreased energy Change in appetite - 3 0  Feeling bad or failure about yourself  3 - 0  Trouble concentrating 1 2 -  Moving slowly or fidgety/restless 3 2 0  Suicidal thoughts 0 0 0  PHQ-9 Score Difficult doing work/chores - - Not difficult at all    GAD 7 : Generalized Anxiety Score 07/18/2015 06/27/2015 05/05/2015  Nervous, Anxious, on Edge Control/stop worrying 3 3 0  Worry too much - different things 3 3 0  Trouble relaxing Restless Easily annoyed or irritable 3 3 0  Afraid - awful might happen 1 3 0  Total GAD 7 Score Assessment & Plan:   Daniel Adams was seen today for referral.  Diagnoses and all orders for this visit:  Chronic lumbar pain -     Ambulatory referral to Neurosurgery -     traMADol (ULTRAM) 50 MG tablet; Take 1 tablet (50 mg total) by mouth every 8 (eight) hours as needed. Fill on 2/30/2017 -     acetaminophen-codeine (TYLENOL #3) 300-30 MG tablet; Take 1 tablet by mouth every 8 (eight) hours as needed for moderate pain.  Chronic cervical pain -     Ambulatory referral to Neurosurgery -     traMADol (ULTRAM) 50 MG tablet; Take 1 tablet (50 mg total) by mouth every 8 (eight) hours as needed. Fill on 2/30/2017 -     acetaminophen-codeine (TYLENOL #3) 300-30 MG tablet; Take 1 tablet by mouth every 8 (eight) hours as needed for moderate pain.    No orders of the defined types were placed in this encounter.    Follow-up: No Follow-up on file.   Dessa Phi MD

## 2015-07-18 NOTE — Progress Notes (Signed)
Requesting referral to [facility] # [phone] Pain on back  Pain scale # 8 Tobacco user -2-3 cigarette per day  No suicidal thoughts in the past two weeks

## 2015-07-18 NOTE — Assessment & Plan Note (Signed)
A: chronic pain P; Referral to neurosurgery Tramadol refilled, due 2/30/2017 Tylenol #3 added for pain control

## 2015-07-18 NOTE — Patient Instructions (Addendum)
Daniel Adams was seen today for referral.  Diagnoses and all orders for this visit:  Chronic lumbar pain -     Ambulatory referral to Neurosurgery -     traMADol (ULTRAM) 50 MG tablet; Take 1 tablet (50 mg total) by mouth every 8 (eight) hours as needed. Fill on 2/30/2017 -     acetaminophen-codeine (TYLENOL #3) 300-30 MG tablet; Take 1 tablet by mouth every 8 (eight) hours as needed for moderate pain.  Chronic cervical pain -     Ambulatory referral to Neurosurgery -     traMADol (ULTRAM) 50 MG tablet; Take 1 tablet (50 mg total) by mouth every 8 (eight) hours as needed. Fill on 2/30/2017 -     acetaminophen-codeine (TYLENOL #3) 300-30 MG tablet; Take 1 tablet by mouth every 8 (eight) hours as needed for moderate pain.   You will be called with neurosurgery appointment details   F/u in 6 weeks for chronic pain   Dr. Armen Pickup

## 2015-07-21 ENCOUNTER — Telehealth: Payer: Self-pay | Admitting: Family Medicine

## 2015-07-21 NOTE — Telephone Encounter (Signed)
Patient called stating that the tylenol 3 is ineffective. Patient would like tramadol. Please follow up

## 2015-07-27 NOTE — Telephone Encounter (Signed)
Patient was given tramadol and tylenol #3 Rx at last OV. He was out of tramadol but too early for a refill, so I prescribed the tylenol #3 to bridge until he could refill tramadol.   I see that I  mistakenly wrote the fill date for tramadol as " 2/30/17" which was 30 days from last fill,  was he able to fill this? If not, I will write a new Rx. nex Rx will be ready for pick up on 07/28/15  Thanks,  Dr. Armen Pickup

## 2015-07-28 NOTE — Telephone Encounter (Signed)
Reviewed [facility] for patient He filled the tramadol, 90 pills on 07/21/2015. He will get a refill for tramadol om 08/18/2015 as long as he does not receive narcotics from any other providers.   On 06/30/15 he received: 06/30/2015 OXYCODONE HCL 10 MG TABLET [id number] 45 15 0 0 [id number] [facility] MD [place], [place] [id number]

## 2015-08-09 ENCOUNTER — Telehealth: Payer: Self-pay | Admitting: Family Medicine

## 2015-08-09 DIAGNOSIS — M545 Low back pain, unspecified: Secondary | ICD-10-CM

## 2015-08-09 DIAGNOSIS — G8929 Other chronic pain: Secondary | ICD-10-CM

## 2015-08-09 DIAGNOSIS — M542 Cervicalgia: Principal | ICD-10-CM

## 2015-08-09 NOTE — Telephone Encounter (Signed)
Pt. Called requesting a med refill on Tramadol. Pt. He is taking four pills instead of three because he is in a lot of pain. Please f/u with pt.

## 2015-08-11 ENCOUNTER — Encounter: Payer: Self-pay | Admitting: Family Medicine

## 2015-08-11 IMAGING — CR DG CERVICAL SPINE 2 OR 3 VIEWS
5 series · 5 of 5 positions shown · non-contrast
Comparison: None.

CLINICAL DATA: Chronic neck pain

EXAM:
CERVICAL SPINE - 2-3 VIEW

[w cervical spine lat]
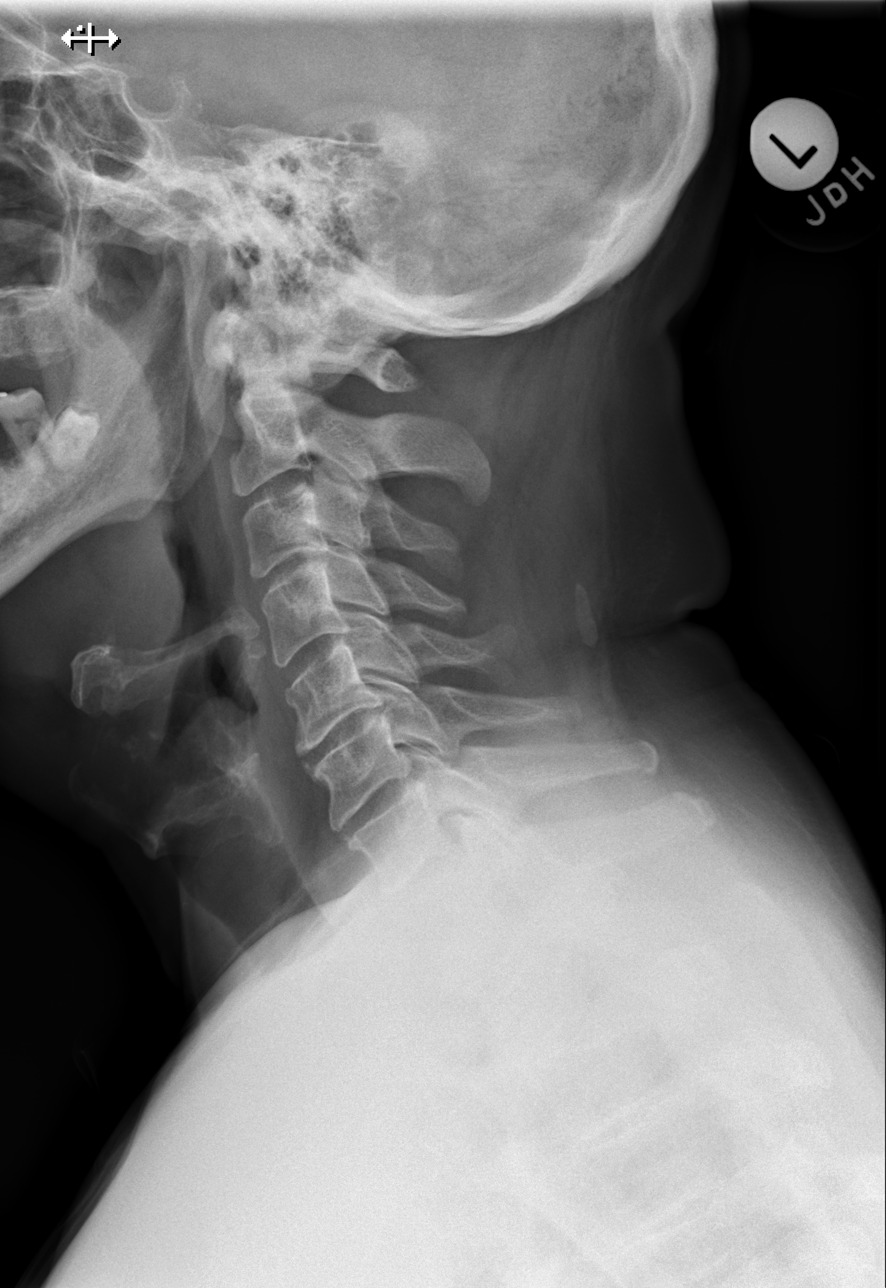

[w cervical spine ap]
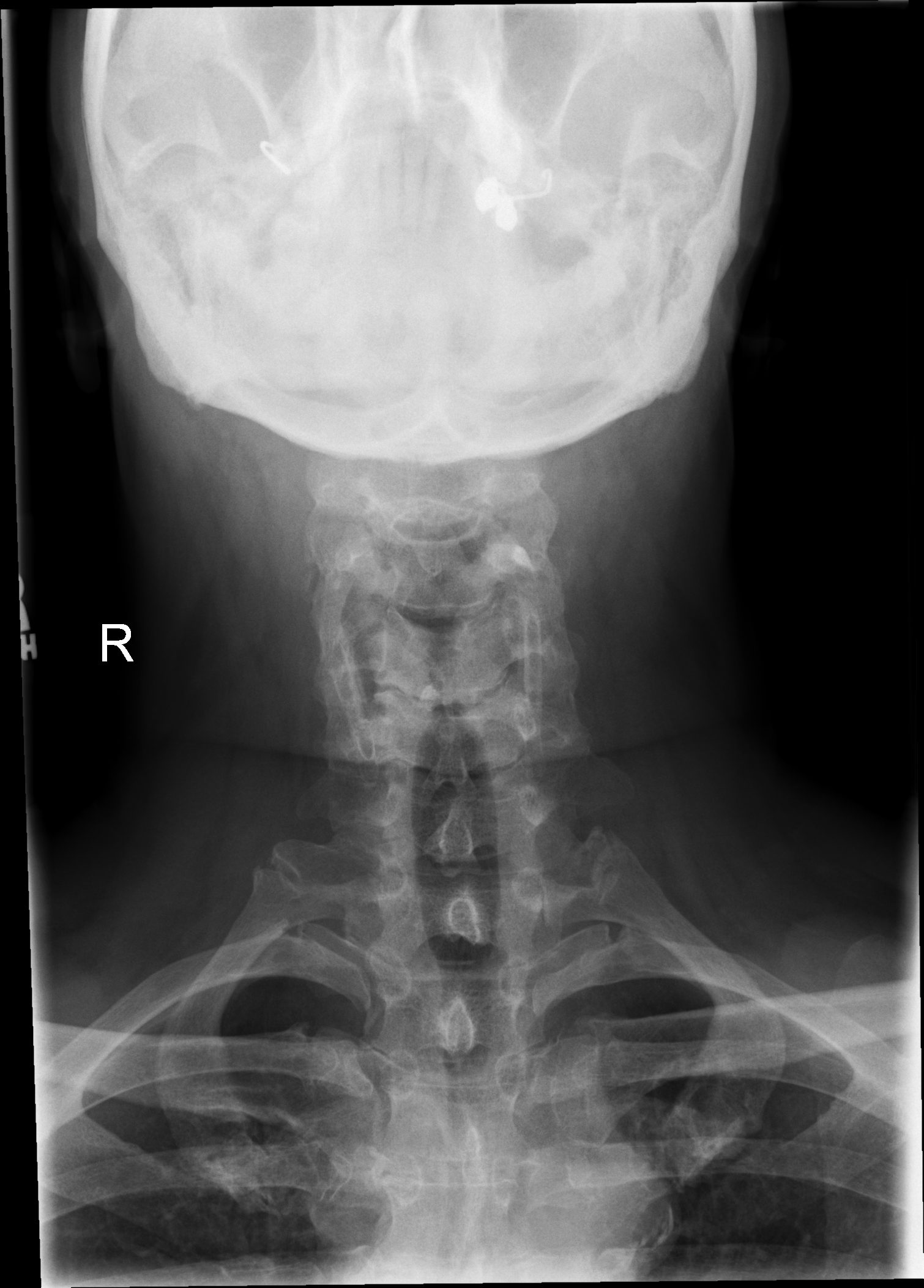

[w cervical swimmers]
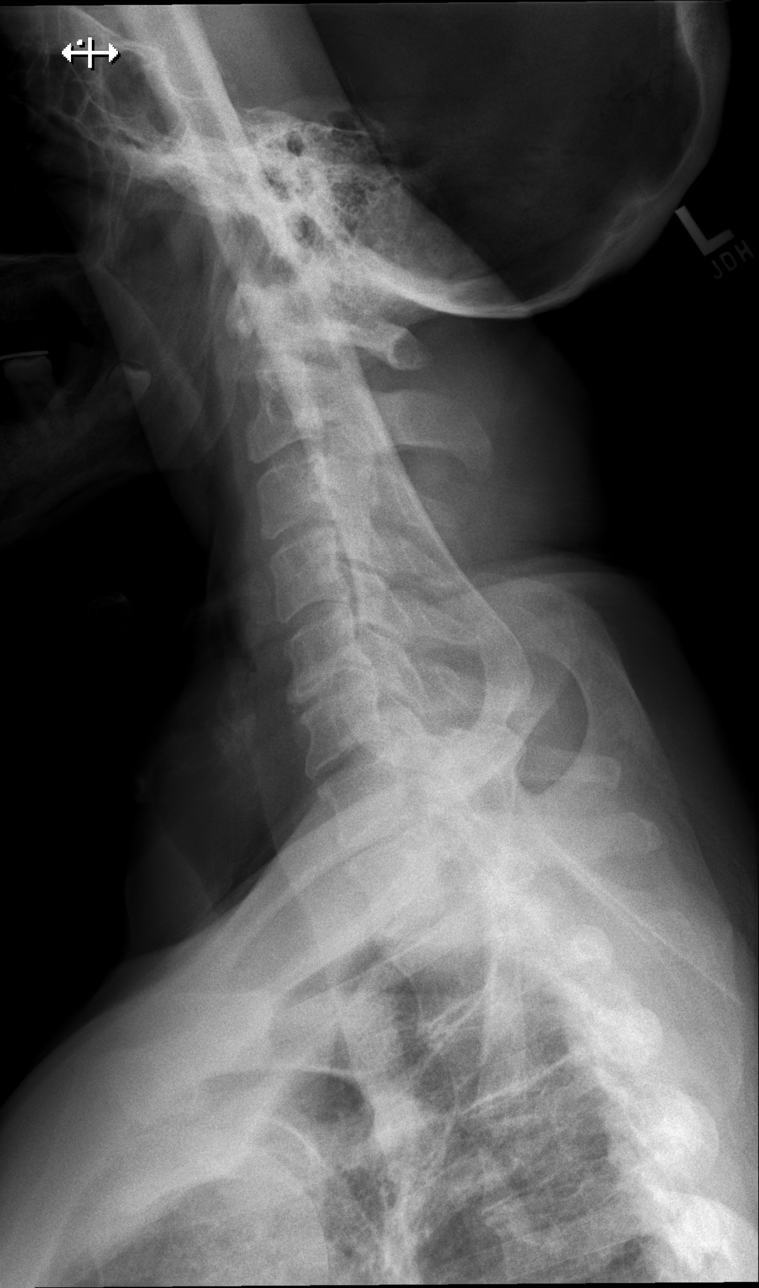

[t cervical spine odontoid (1 of 2)]
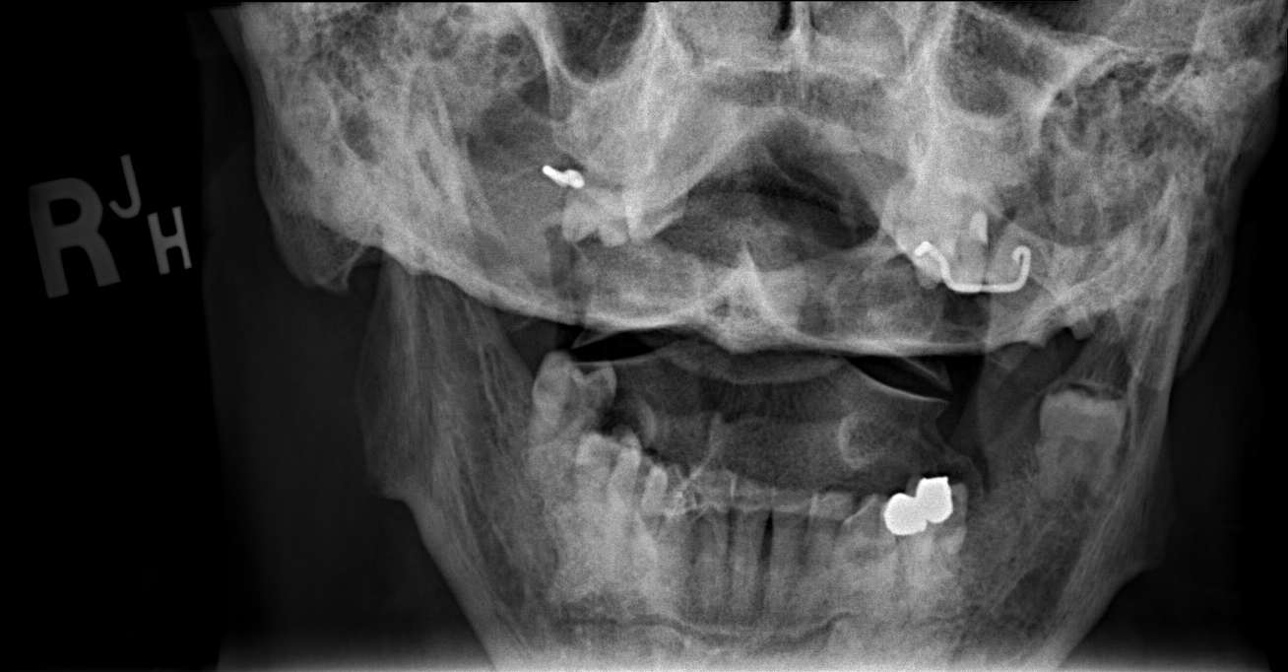

[t cervical spine odontoid (2 of 2)]
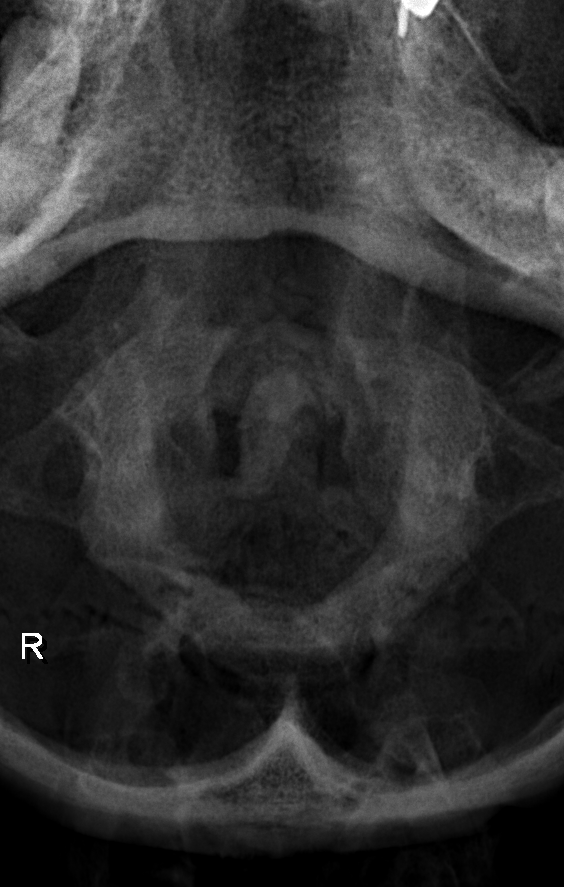

[5 of 5 positions shown; findings below may reference images not displayed]

FINDINGS: Normal cervical alignment.  Negative for fracture or mass

Mild disc degeneration and spurring C3-4 and C4-5. Moderate disc
degeneration and spurring C5-6 and C6-7.
IMPRESSION: Cervical spondylosis.  No acute bony abnormality.

## 2015-08-11 MED ORDER — TRAMADOL HCL 50 MG PO TABS: 50.0000 mg | ORAL_TABLET | Freq: Four times a day (QID) | ORAL | Status: DC | PRN

## 2015-08-11 NOTE — Progress Notes (Signed)
Patient ID: Daniel Adams, male   DOB: 10/02/1960, 55 y.o.   MRN: [id number] Patient has established with [facility] management I will no longer prescribe tramadol or tylenol #3

## 2015-08-11 NOTE — Telephone Encounter (Signed)
tramaol refilled

## 2015-09-06 ENCOUNTER — Encounter: Payer: Self-pay | Admitting: Family Medicine

## 2015-09-06 ENCOUNTER — Ambulatory Visit: Payer: Medicaid Other | Attending: Family Medicine | Admitting: Family Medicine

## 2015-09-06 VITALS — BP 139/85 | HR 74 | Temp 98.8°F | Resp 16 | Ht 62.0 in | Wt 148.0 lb

## 2015-09-06 DIAGNOSIS — M545 Low back pain: Secondary | ICD-10-CM | POA: Insufficient documentation

## 2015-09-06 DIAGNOSIS — F329 Major depressive disorder, single episode, unspecified: Secondary | ICD-10-CM | POA: Diagnosis not present

## 2015-09-06 DIAGNOSIS — M1990 Unspecified osteoarthritis, unspecified site: Secondary | ICD-10-CM | POA: Diagnosis not present

## 2015-09-06 DIAGNOSIS — R569 Unspecified convulsions: Secondary | ICD-10-CM | POA: Diagnosis not present

## 2015-09-06 DIAGNOSIS — K219 Gastro-esophageal reflux disease without esophagitis: Secondary | ICD-10-CM | POA: Diagnosis not present

## 2015-09-06 DIAGNOSIS — M542 Cervicalgia: Secondary | ICD-10-CM | POA: Diagnosis not present

## 2015-09-06 DIAGNOSIS — F419 Anxiety disorder, unspecified: Secondary | ICD-10-CM | POA: Insufficient documentation

## 2015-09-06 DIAGNOSIS — Z79899 Other long term (current) drug therapy: Secondary | ICD-10-CM | POA: Insufficient documentation

## 2015-09-06 DIAGNOSIS — I10 Essential (primary) hypertension: Secondary | ICD-10-CM | POA: Diagnosis not present

## 2015-09-06 DIAGNOSIS — F17210 Nicotine dependence, cigarettes, uncomplicated: Secondary | ICD-10-CM | POA: Diagnosis not present

## 2015-09-06 DIAGNOSIS — G40909 Epilepsy, unspecified, not intractable, without status epilepticus: Secondary | ICD-10-CM | POA: Insufficient documentation

## 2015-09-06 DIAGNOSIS — G8929 Other chronic pain: Secondary | ICD-10-CM

## 2015-09-06 NOTE — Patient Instructions (Addendum)
Daniel Adams was seen today for establish care and hypertension.  Diagnoses and all orders for this visit:  Chronic lumbar pain  Chronic cervical pain  Seizure (HCC)   I will wait to hear from you [facility] before mailing in the form  F/u in 3 months for HTN  Dr. Armen PickupFunches

## 2015-09-06 NOTE — Telephone Encounter (Signed)
Pt. Dropped of Transportation paperwork to be filled out by provider. Paperwork will be put in providers box.

## 2015-09-06 NOTE — Progress Notes (Signed)
Subjective:  Patient ID: Daniel Adams, male    DOB: 12/13/1960  Age: 55 y.o. MRN: [id number]  CC: Establish Care and Hypertension   HPI Daniel Adams has hx of substance abuse ( ETOH, BZ), HTN, chronic pain, depression, anxiety, insomnia he presents for    1. Chronic pain: in back. He has established with pain management. He is now on oxycodone and ativan.  Pain management has referred to mental health for substance abuse counseling and ortho for possible lumbar injection.   2. DMV medical review form: he brings in his [facility] He has hx of ETOH withdrawal seizures. He has not had a seizure in many years. He has never been on anti-epileptic medication.   3. HTN: compliant with norvasc 10 mg daily. No HA,CP, SOB or leg swelling.   Social History  Substance Use Topics  . Smoking status: Current Every Day Smoker    Types: Cigarettes  . Smokeless tobacco: Never Used     Comment: Smoking 3-4 cigs/ day  . Alcohol Use: No     Comment: Former   Past Medical History  Diagnosis Date  . Hypertension   . Arthritis   . Depression   . GERD (gastroesophageal reflux disease)   . Seizure disorder (HCC)   . H. pylori infection     DX blood test     Outpatient Prescriptions Prior to Visit  Medication Sig Dispense Refill  . amLODipine (NORVASC) 10 MG tablet Take 1 tablet (10 mg total) by mouth daily. 30 tablet 5  . atorvastatin (LIPITOR) 40 MG tablet Take 1 tablet (40 mg total) by mouth daily. 90 tablet 3  . escitalopram (LEXAPRO) 20 MG tablet Take 1 tablet (20 mg total) by mouth daily. Reported on 06/27/2015 30 tablet 5  . hydrOXYzine (VISTARIL) 50 MG capsule Take 50 mg by mouth 3 (three) times daily as needed.    . magnesium hydroxide (MILK OF MAGNESIA) 400 MG/5ML suspension Take 5 mLs by mouth daily as needed for mild constipation. 360 mL 0  . naloxegol oxalate (MOVANTIK) 12.5 MG TABS tablet Take 25 mg by mouth daily.    . QUEtiapine (SEROQUEL) 100 MG tablet Take  100 mg by mouth.     No facility-administered medications prior to visit.    ROS Review of Systems  Constitutional: Negative for fever, chills, fatigue and unexpected weight change.  Eyes: Negative for visual disturbance.  Respiratory: Negative for cough and shortness of breath.   Cardiovascular: Negative for chest pain, palpitations and leg swelling.  Gastrointestinal: Negative for nausea, vomiting, abdominal pain, diarrhea, constipation and blood in stool.  Endocrine: Negative for polydipsia, polyphagia and polyuria.  Musculoskeletal: Positive for back pain, arthralgias, gait problem and neck pain. Negative for myalgias.  Skin: Negative for rash.  Allergic/Immunologic: Negative for immunocompromised state.  Hematological: Negative for adenopathy. Does not bruise/bleed easily.  Psychiatric/Behavioral: Positive for dysphoric mood. Negative for suicidal ideas and sleep disturbance. The patient is nervous/anxious.     Objective:  BP 139/85 mmHg  Pulse 74  Temp(Src) 98.8 F (37.1 C) (Oral)  Resp 16  Ht 5\' 2"  (1.575 m)  Wt 148 lb (67.132 kg)  BMI 27.06 kg/m2  SpO2 98%  BP/Weight 09/06/2015 07/18/2015 06/27/2015  Systolic BP 139 129 120  Diastolic BP 85 83 81  Wt. (Lbs) 148 142 149  BMI 27.06 25.97 27.25   Physical Exam  Constitutional: He appears well-developed and well-nourished. No distress.  HENT:  Head: Normocephalic and atraumatic.  Neck: Normal range of motion. Neck supple.  Pulmonary/Chest: Effort normal.  Musculoskeletal: He exhibits no edema.  Neurological: He is alert.  Skin: Skin is warm and dry. No rash noted. No erythema.  Psychiatric: He has a normal mood and affect.    Assessment & Plan:   There are no diagnoses linked to this encounter. Daniel Adams was seen today for establish care and hypertension.  Diagnoses and all orders for this visit:  Chronic lumbar pain  Chronic cervical pain  Seizure (HCC)   No orders of the defined types were placed in this  encounter.    Follow-up: No Follow-up on file.   Dessa Phi MD

## 2015-09-06 NOTE — Progress Notes (Signed)
F/U HTN chronic back pain  Taking medication as prescribed  Pain scale #7  Back pain  Tobacco user 4 cigarette per day  No suicidal thoughts in the past two weeks

## 2015-09-07 ENCOUNTER — Encounter: Payer: Self-pay | Admitting: Clinical

## 2015-09-07 NOTE — Assessment & Plan Note (Signed)
A: well controlled Med: compliant P: continue current regimen  

## 2015-09-07 NOTE — Progress Notes (Signed)
Depression screen [facility] 2/9 09/06/2015 07/18/2015 06/27/2015 05/05/2015 05/05/2015  Decreased Interest 3 0 3 0 0  Down, Depressed, Hopeless 3 3 3  0 0  PHQ - 2 Score 6 3 6  0 0  Altered sleeping 3 3 3  0 -  Tired, decreased energy 3 3 3 1  -  Change in appetite 3 - 3 0 -  Feeling bad or failure about yourself  1 3 - 0 -  Trouble concentrating 1 1 2  - -  Moving slowly or fidgety/restless 3 3 2  0 -  Suicidal thoughts 0 0 0 0 -  PHQ-9 Score 20 16 19 1  -  Difficult doing work/chores - - - Not difficult at all -    GAD 7 : Generalized Anxiety Score 09/06/2015 07/18/2015 06/27/2015 05/05/2015  Nervous, Anxious, on Edge 1 3 3 1   Control/stop worrying 1 3 3  0  Worry too much - different things 1 3 3  0  Trouble relaxing 3 3 3 3   Restless 2 3 1 1   Easily annoyed or irritable 2 3 3  0  Afraid - awful might happen 1 1 3  0  Total GAD 7 Score 11 19 19  5

## 2015-09-07 NOTE — Assessment & Plan Note (Addendum)
Treated at pain management and ortho

## 2015-09-07 NOTE — Assessment & Plan Note (Signed)
Hx of ETOH seizures DMV medical review form received. Patient has requested to no longer require a medical exam to renew his license.  He has asked that I wait to send in the form until he receives a response about his request

## 2015-10-07 ENCOUNTER — Ambulatory Visit: Payer: Medicaid Other | Attending: Family Medicine | Admitting: Family Medicine

## 2015-10-07 ENCOUNTER — Encounter: Payer: Self-pay | Admitting: Family Medicine

## 2015-10-07 VITALS — BP 125/82 | HR 93 | Temp 98.1°F | Resp 16 | Ht 62.0 in | Wt 149.0 lb

## 2015-10-07 DIAGNOSIS — Z79899 Other long term (current) drug therapy: Secondary | ICD-10-CM | POA: Insufficient documentation

## 2015-10-07 DIAGNOSIS — F418 Other specified anxiety disorders: Secondary | ICD-10-CM | POA: Diagnosis not present

## 2015-10-07 DIAGNOSIS — M542 Cervicalgia: Secondary | ICD-10-CM | POA: Diagnosis not present

## 2015-10-07 DIAGNOSIS — M545 Low back pain, unspecified: Secondary | ICD-10-CM

## 2015-10-07 DIAGNOSIS — I10 Essential (primary) hypertension: Secondary | ICD-10-CM | POA: Insufficient documentation

## 2015-10-07 DIAGNOSIS — M549 Dorsalgia, unspecified: Secondary | ICD-10-CM | POA: Diagnosis not present

## 2015-10-07 DIAGNOSIS — G8929 Other chronic pain: Secondary | ICD-10-CM | POA: Insufficient documentation

## 2015-10-07 DIAGNOSIS — E785 Hyperlipidemia, unspecified: Secondary | ICD-10-CM

## 2015-10-07 DIAGNOSIS — F17210 Nicotine dependence, cigarettes, uncomplicated: Secondary | ICD-10-CM | POA: Insufficient documentation

## 2015-10-07 MED ORDER — TRAZODONE HCL 50 MG PO TABS: 50.0000 mg | ORAL_TABLET | Freq: Every day | ORAL | Status: DC

## 2015-10-07 MED ORDER — ATORVASTATIN CALCIUM 40 MG PO TABS: 40.0000 mg | ORAL_TABLET | Freq: Every day | ORAL | Status: DC

## 2015-10-07 MED ORDER — TRAMADOL HCL 50 MG PO TABS
50.0000 mg | ORAL_TABLET | Freq: Four times a day (QID) | ORAL | Status: DC | PRN
Start: 2015-10-07 — End: 2015-12-19

## 2015-10-07 MED ORDER — AMLODIPINE BESYLATE 10 MG PO TABS: 10.0000 mg | ORAL_TABLET | Freq: Every day | ORAL | Status: DC

## 2015-10-07 NOTE — Progress Notes (Signed)
Chronic back pain  Pain scale #9 Tobacco user 4-5 cigarette  No suicidal thought in the past two weeks

## 2015-10-07 NOTE — Progress Notes (Signed)
Subjective:  Patient ID: Daniel Adams, male    DOB: 05/14/1961  Age: 55 y.o. MRN: [id number]  CC: Back Pain   HPI Daniel Adams has hx of substance abuse ( ETOH, BZ), HTN, chronic pain, depression, anxiety, insomnia he presents for    1. Chronic pain: in back. He been discharged from pain management. He is unable to get established to another pain management due to positive UDS in 05/2015.  He request pain medicine from me. He reports that he is no longer using cocaine. He is not drinking ETOH. He has severe back pain with sleep disturbance. He is taking lexapro for depression.  2. DMV form: he needs a DMV Medical Review Form completed due to his chronic medical disease and a DWI.  Social History  Substance Use Topics  . Smoking status: Current Every Day Smoker    Types: Cigarettes  . Smokeless tobacco: Never Used     Comment: Smoking 3-4 cigs/ day  . Alcohol Use: No     Comment: Former   Past Medical History  Diagnosis Date  . Hypertension   . Arthritis   . Depression   . GERD (gastroesophageal reflux disease)   . Seizure disorder (HCC)   . H. pylori infection     DX blood test     Outpatient Prescriptions Prior to Visit  Medication Sig Dispense Refill  . amLODipine (NORVASC) 10 MG tablet Take 1 tablet (10 mg total) by mouth daily. 30 tablet 5  . escitalopram (LEXAPRO) 20 MG tablet Take 1 tablet (20 mg total) by mouth daily. Reported on 06/27/2015 30 tablet 5  . hydrOXYzine (VISTARIL) 50 MG capsule Take 50 mg by mouth 3 (three) times daily as needed.    Marland Kitchen. LORazepam (ATIVAN) 1 MG tablet Take 1 mg by mouth daily.    . magnesium hydroxide (MILK OF MAGNESIA) 400 MG/5ML suspension Take 5 mLs by mouth daily as needed for mild constipation. 360 mL 0  . naloxegol oxalate (MOVANTIK) 12.5 MG TABS tablet Take 25 mg by mouth daily.    . QUEtiapine (SEROQUEL) 100 MG tablet Take 100 mg by mouth.    Marland Kitchen. atorvastatin (LIPITOR) 40 MG tablet Take 1 tablet (40 mg total) by mouth daily. (Patient  not taking: Reported on 10/07/2015) 90 tablet 3  . Oxycodone HCl 10 MG TABS Take 10 mg by mouth every 8 (eight) hours. Per [facility]     No facility-administered medications prior to visit.    ROS Review of Systems  Constitutional: Negative for fever, chills, fatigue and unexpected weight change.  Eyes: Negative for visual disturbance.  Respiratory: Negative for cough and shortness of breath.   Cardiovascular: Negative for chest pain, palpitations and leg swelling.  Gastrointestinal: Negative for nausea, vomiting, abdominal pain, diarrhea, constipation and blood in stool.  Endocrine: Negative for polydipsia, polyphagia and polyuria.  Musculoskeletal: Positive for back pain, arthralgias, gait problem and neck pain. Negative for myalgias.  Skin: Negative for rash.  Allergic/Immunologic: Negative for immunocompromised state.  Hematological: Negative for adenopathy. Does not bruise/bleed easily.  Psychiatric/Behavioral: Positive for dysphoric mood. Negative for suicidal ideas and sleep disturbance. The patient is nervous/anxious.     Objective:  BP 125/82 mmHg  Pulse 93  Temp(Src) 98.1 F (36.7 C) (Oral)  Resp 16  Ht 5\' 2"  (1.575 m)  Wt 149 lb (67.586 kg)  BMI 27.25 kg/m2  SpO2 97%  BP/Weight 10/07/2015 09/06/2015 07/18/2015  Systolic BP 125 139 129  Diastolic BP 82 85 83  Wt. (Lbs) 149 148 142  BMI 27.25 27.06 25.97   Physical Exam  Constitutional: He appears well-developed and well-nourished. No distress.  HENT:  Head: Normocephalic and atraumatic.  Neck: Normal range of motion. Neck supple.  Pulmonary/Chest: Effort normal.  Musculoskeletal: He exhibits no edema.  Neurological: He is alert.  Skin: Skin is warm and dry. No rash noted. No erythema.  Psychiatric: He has a normal mood and affect.    Assessment & Plan:   Krishna was seen today for back pain.  Diagnoses and all orders for this visit:  Chronic lumbar pain -     traMADol (ULTRAM) 50 MG tablet; Take  1 tablet (50 mg total) by mouth every 6 (six) hours as needed.  Chronic cervical pain -     traMADol (ULTRAM) 50 MG tablet; Take 1 tablet (50 mg total) by mouth every 6 (six) hours as needed.  Depression with anxiety -     traZODone (DESYREL) 50 MG tablet; Take 1 tablet (50 mg total) by mouth at bedtime.  Essential hypertension -     amLODipine (NORVASC) 10 MG tablet; Take 1 tablet (10 mg total) by mouth daily.  Hyperlipidemia -     atorvastatin (LIPITOR) 40 MG tablet; Take 1 tablet (40 mg total) by mouth daily.   Follow-up: No Follow-up on file.   Dessa Phi MD

## 2015-10-07 NOTE — Patient Instructions (Addendum)
Daniel Adams was seen today for back pain.  Diagnoses and all orders for this visit:  Chronic lumbar pain -     traMADol (ULTRAM) 50 MG tablet; Take 1 tablet (50 mg total) by mouth every 6 (six) hours as needed.  Chronic cervical pain -     traMADol (ULTRAM) 50 MG tablet; Take 1 tablet (50 mg total) by mouth every 6 (six) hours as needed.  Depression with anxiety -     traZODone (DESYREL) 50 MG tablet; Take 1 tablet (50 mg total) by mouth at bedtime.  Essential hypertension -     amLODipine (NORVASC) 10 MG tablet; Take 1 tablet (10 mg total) by mouth daily.  Hyperlipidemia -     atorvastatin (LIPITOR) 40 MG tablet; Take 1 tablet (40 mg total) by mouth daily.   Starting tramadol for pain control Taper off lexapro my taking 1/2 tablet daily for one week, then 1/2 tab every other day for one week then stop. This is to reduce the risk of interaction with tramadol.  Replace tramadol with nightly trazodone which does not interact with tramadol.  [facility].   F/u in 3 months for chronic pain, HTN, high cholesterol and depression  Dr. Armen PickupFunches  Serotonin Syndrome Serotonin is a brain chemical that regulates the nervous system, which includes the brain, spinal cord, and nerves. Serotonin appears to play a role in all types of behavior, including appetite, emotions, movement, thinking, and response to stress. Excessively high levels of serotonin in the body can cause serotonin syndrome, which is a very dangerous condition. CAUSES This condition can be caused by taking medicines or drugs that increase the level of serotonin in your body. These include:  Antidepressant medicines.  Migraine medicines.  Certain pain medicines.  Certain recreational drugs, including ecstasy, LSD, cocaine, and amphetamines.  Over-the-counter cough or cold medicines that contain dextromethorphan.  Certain herbal supplements, including St. John's wort, ginseng, and nutmeg. This  condition usually occurs when you take these medicines or drugs in combination, but it can also happen with a high dose of a single medicine or drug. RISK FACTORS This condition is more likely to develop in:  People who have recently increased the dosage of medicine that increases the serotonin level.  People who just started taking medicine that increases the serotonin level. SYMPTOMS Symptoms of this condition usually happens within several hours of a medicine change. Symptoms include:  Headache.  Muscle twitching or stiffness.  Diarrhea.  Confusion.  Restlessness or agitation.  Shivering or goose bumps.  Loss of muscle coordination.  Rapid heart rate.  Sweating. Severe cases of serotonin syndromecan cause:  Irregular heartbeat.  Seizures.  Loss of consciousness.  High fever. DIAGNOSIS This condition is diagnosed with a medical history and physical exam. You will be asked aboutyour symptoms and your use of medicines and recreational drugs. Your health care provider may also order lab work or additional tests to rule out other causes of your symptoms. TREATMENT The treatment for this condition depends on the severity of your symptoms. For mild cases, stopping the medicine that caused your condition is usually all that is needed. For moderate to severe cases, hospitalization is required to monitor you and to prevent further muscle damage. HOME CARE INSTRUCTIONS  Take over-the-counter and prescription medicines only as told by your health care provider. This is important.  Check with your health care provider before you start taking any new prescriptions, over-the-counter medicines, herbs, or supplements.  Avoid combining any medicines that can cause this condition to occur.  Keep all follow-up visits as told by your health care provider.This is important.  Maintain a healthy lifestyle.  Eat healthy foods.  Get plenty of sleep.  Exercise regularly.  Do not  drink alcohol.  Do not use recreational drugs. SEEK MEDICAL CARE IF:  Medicines do not seem to be helping.  Your symptoms do not improve or they get worse.  You have trouble taking care of yourself. SEEK IMMEDIATE MEDICAL CARE IF:  You have worsening confusion, severe headache, chest pain, high fever, seizures, or loss of consciousness.  You have serious thoughts about hurting yourself or others.  You experience serious side effects of medicine, such as swelling of your face, lips, tongue, or throat.   This information is not intended to replace advice given to you by your health care provider. Make sure you discuss any questions you have with your health care provider.   Document Released: 06/21/2004 Document Revised: 09/28/2014 Document Reviewed: 05/27/2014 Elsevier Interactive Patient Education [employer]

## 2015-10-10 NOTE — Assessment & Plan Note (Signed)
Chronic pain, unable to establish with pain management due to + cocaine on UDS in 05/2015  Plan: Tramadol Trazodone for sleep aid and depression D/c lexapro

## 2015-10-20 ENCOUNTER — Ambulatory Visit: Payer: Medicaid Other | Admitting: Family Medicine

## 2015-10-25 ENCOUNTER — Telehealth: Payer: Self-pay | Admitting: Family Medicine

## 2015-10-25 NOTE — Telephone Encounter (Signed)
Called patient to get substance abuse hx of DMV medical review form Verified name and DOB  History obtained and documented on form

## 2015-10-25 NOTE — Telephone Encounter (Signed)
Form completed  Form will copied, copy scanned, original mailed along with patient written letter

## 2015-12-05 ENCOUNTER — Other Ambulatory Visit: Payer: Self-pay | Admitting: Pharmacist

## 2015-12-05 MED ORDER — NALOXEGOL OXALATE 12.5 MG PO TABS: 25.0000 mg | ORAL_TABLET | Freq: Every day | ORAL | Status: DC

## 2015-12-19 ENCOUNTER — Encounter: Payer: Self-pay | Admitting: Family Medicine

## 2015-12-19 ENCOUNTER — Ambulatory Visit: Payer: Medicaid Other | Attending: Family Medicine | Admitting: Family Medicine

## 2015-12-19 VITALS — BP 154/95 | HR 71 | Temp 98.9°F | Resp 18 | Ht 62.0 in | Wt 148.4 lb

## 2015-12-19 DIAGNOSIS — H9202 Otalgia, left ear: Secondary | ICD-10-CM | POA: Insufficient documentation

## 2015-12-19 DIAGNOSIS — G8929 Other chronic pain: Secondary | ICD-10-CM | POA: Diagnosis not present

## 2015-12-19 DIAGNOSIS — M542 Cervicalgia: Secondary | ICD-10-CM

## 2015-12-19 DIAGNOSIS — H6092 Unspecified otitis externa, left ear: Secondary | ICD-10-CM | POA: Insufficient documentation

## 2015-12-19 DIAGNOSIS — M545 Low back pain: Secondary | ICD-10-CM

## 2015-12-19 MED ORDER — TRAMADOL HCL 50 MG PO TABS: 50.0000 mg | ORAL_TABLET | Freq: Four times a day (QID) | ORAL | 2 refills | Status: DC | PRN

## 2015-12-19 MED ORDER — NEOMYCIN-POLYMYXIN-HC 3.5-10000-1 OT SOLN: 4.0000 [drp] | Freq: Four times a day (QID) | OTIC | 0 refills | Status: DC

## 2015-12-19 NOTE — Progress Notes (Signed)
Subjective:  Patient ID: Daniel Adams, male    DOB: May 13, 1961  Age: 55 y.o. MRN: [id number]  CC: Ear Pain (left)   HPI Daniel Adams has HTN, chronic pain in neck and low back he presents for   1. Left ear pain: x 2 days. Went swimming. L ear got clogged up. Tried debrox without relief. No fever or chills. He has a bit of ear pain.   2. Chronic back pain: he request tramadol refill.   Social History  Substance Use Topics  . Smoking status: Current Every Day Smoker    Types: Cigarettes  . Smokeless tobacco: Never Used     Comment: Smoking 3-4 cigs/ day  . Alcohol use No     Comment: Former    Outpatient Medications Prior to Visit  Medication Sig Dispense Refill  . amLODipine (NORVASC) 10 MG tablet Take 1 tablet (10 mg total) by mouth daily. 30 tablet 5  . LORazepam (ATIVAN) 1 MG tablet Take 1 mg by mouth daily.    . magnesium hydroxide (MILK OF MAGNESIA) 400 MG/5ML suspension Take 5 mLs by mouth daily as needed for mild constipation. 360 mL 0  . naloxegol oxalate (MOVANTIK) 12.5 MG TABS tablet Take 2 tablets (25 mg total) by mouth daily. 30 tablet 0  . QUEtiapine (SEROQUEL) 100 MG tablet Take 100 mg by mouth.    . traMADol (ULTRAM) 50 MG tablet Take 1 tablet (50 mg total) by mouth every 6 (six) hours as needed. 120 tablet 2  . traZODone (DESYREL) 50 MG tablet Take 1 tablet (50 mg total) by mouth at bedtime. 30 tablet 5  . atorvastatin (LIPITOR) 40 MG tablet Take 1 tablet (40 mg total) by mouth daily. (Patient not taking: Reported on 12/19/2015) 90 tablet 3  . hydrOXYzine (VISTARIL) 50 MG capsule Take 50 mg by mouth 3 (three) times daily as needed.     No facility-administered medications prior to visit.     ROS Review of Systems  Constitutional: Negative for chills, fatigue, fever and unexpected weight change.  HENT: Positive for ear pain. Negative for ear discharge and facial swelling.   Eyes: Negative for visual disturbance.  Respiratory: Negative for cough and shortness  of breath.   Cardiovascular: Negative for chest pain, palpitations and leg swelling.  Gastrointestinal: Negative for abdominal pain, blood in stool, constipation, diarrhea, nausea and vomiting.  Endocrine: Negative for polydipsia, polyphagia and polyuria.  Musculoskeletal: Positive for arthralgias, back pain, gait problem and neck pain. Negative for myalgias.  Skin: Negative for rash.  Allergic/Immunologic: Negative for immunocompromised state.  Hematological: Negative for adenopathy. Does not bruise/bleed easily.  Psychiatric/Behavioral: Positive for dysphoric mood. Negative for sleep disturbance and suicidal ideas. The patient is nervous/anxious.     Objective:  BP (!) 154/95 (BP Location: Right Arm, Patient Position: Sitting, Cuff Size: Normal)   Pulse 71   Temp 98.9 F (37.2 C) (Oral)   Resp 18   Ht  (1.575 m)   Wt 148 lb 6.4 oz (67.3 kg)   SpO2 99%   BMI 27.14 kg/m   BP/Weight 12/19/2015 10/07/2015 09/06/2015  Systolic BP 154 125 139  Diastolic BP 95 82 85  Wt. (Lbs) 148.4 149 148  BMI 27.14 27.25 27.06   Physical Exam  Constitutional: He appears well-developed and well-nourished. No distress.  HENT:  Head: Normocephalic and atraumatic.  Right Ear: Tympanic membrane, external ear and ear canal normal.  TM blocked for soft cerumen There is slight tragus tenderness   Neck:  Normal range of motion. Neck supple.  Cardiovascular: Intact distal pulses.   Pulmonary/Chest: Effort normal.  Musculoskeletal: He exhibits no edema.  Lymphadenopathy:    He has no cervical adenopathy.  Neurological: He is alert.  Skin: Skin is warm and dry. No rash noted. No erythema.  Psychiatric: He has a normal mood and affect.     Assessment & Plan:  Manual was seen today for ear pain.  Diagnoses and all orders for this visit:  Left otitis externa -     neomycin-polymyxin-hydrocortisone (CORTISPORIN) otic solution; Place 4 drops into the left ear 4 (four) times daily.  Chronic  lumbar pain -     traMADol (ULTRAM) 50 MG tablet; Take 1 tablet (50 mg total) by mouth every 6 (six) hours as needed.  Chronic cervical pain -     traMADol (ULTRAM) 50 MG tablet; Take 1 tablet (50 mg total) by mouth every 6 (six) hours as needed.   There are no diagnoses linked to this encounter.  No orders of the defined types were placed in this encounter.   Follow-up: Return in about 3 months (around 03/20/2016) for HTN and chronic back pain .   Dessa Phi MD

## 2015-12-19 NOTE — Progress Notes (Signed)
Patient is here for Left Ear Pain  Patient complains of diving in a pool a few days ago and has had discomfort in his left ear ever since. Patient used OTC ear irrigation and states the discomfort is worse.  Patient has not taken medication today and the patient has not eaten.

## 2015-12-19 NOTE — Patient Instructions (Addendum)
Daniel Adams was seen today for ear pain.  Diagnoses and all orders for this visit:  Left otitis externa -     neomycin-polymyxin-hydrocortisone (CORTISPORIN) otic solution; Place 4 drops into the left ear 4 (four) times daily.  Chronic lumbar pain -     traMADol (ULTRAM) 50 MG tablet; Take 1 tablet (50 mg total) by mouth every 6 (six) hours as needed.  Chronic cervical pain -     traMADol (ULTRAM) 50 MG tablet; Take 1 tablet (50 mg total) by mouth every 6 (six) hours as needed.    F/u in 3 months for chronic pain  Dr. Armen Pickup    Otitis Externa Otitis externa is a bacterial or fungal infection of the outer ear canal. This is the area from the eardrum to the outside of the ear. Otitis externa is sometimes called "swimmer's ear." CAUSES  Possible causes of infection include:  Swimming in dirty water.  Moisture remaining in the ear after swimming or bathing.  Mild injury (trauma) to the ear.  Objects stuck in the ear (foreign body).  Cuts or scrapes (abrasions) on the outside of the ear. SIGNS AND SYMPTOMS  The first symptom of infection is often itching in the ear canal. Later signs and symptoms may include swelling and redness of the ear canal, ear pain, and yellowish-white fluid (pus) coming from the ear. The ear pain may be worse when pulling on the earlobe. DIAGNOSIS  Your health care provider will perform a physical exam. A sample of fluid may be taken from the ear and examined for bacteria or fungi. TREATMENT  Antibiotic ear drops are often given for 10 to 14 days. Treatment may also include pain medicine or corticosteroids to reduce itching and swelling. HOME CARE INSTRUCTIONS   Apply antibiotic ear drops to the ear canal as prescribed by your health care provider.  Take medicines only as directed by your health care provider.  If you have diabetes, follow any additional treatment instructions from your health care provider.  Keep all follow-up visits as directed by your  health care provider. PREVENTION   Keep your ear dry. Use the corner of a towel to absorb water out of the ear canal after swimming or bathing.  Avoid scratching or putting objects inside your ear. This can damage the ear canal or remove the protective wax that lines the canal. This makes it easier for bacteria and fungi to grow.  Avoid swimming in lakes, polluted water, or poorly chlorinated pools.  You may use ear drops made of rubbing alcohol and vinegar after swimming. Combine equal parts of white vinegar and alcohol in a bottle. Put 3 or 4 drops into each ear after swimming. SEEK MEDICAL CARE IF:   You have a fever.  Your ear is still red, swollen, painful, or draining pus after 3 days.  Your redness, swelling, or pain gets worse.  You have a severe headache.  You have redness, swelling, pain, or tenderness in the area behind your ear. MAKE SURE YOU:   Understand these instructions.  Will watch your condition.  Will get help right away if you are not doing well or get worse.   This information is not intended to replace advice given to you by your health care provider. Make sure you discuss any questions you have with your health care provider.   Document Released: 05/14/2005 Document Revised: 06/04/2014 Document Reviewed: 05/31/2011 Elsevier Interactive Patient Education [employer]

## 2015-12-20 DIAGNOSIS — H6092 Unspecified otitis externa, left ear: Secondary | ICD-10-CM | POA: Insufficient documentation

## 2015-12-20 NOTE — Assessment & Plan Note (Signed)
A: L OE P: Cortisporin

## 2015-12-26 ENCOUNTER — Ambulatory Visit: Payer: Medicaid Other | Admitting: Family Medicine

## 2016-02-03 ENCOUNTER — Telehealth: Payer: Self-pay | Admitting: Family Medicine

## 2016-02-03 NOTE — Telephone Encounter (Signed)
Pt calling stating he is leaving town on September 20th and will be gone for six weeks  Pt wants to know if he will be able to get his Rx for Tramadol for the six weeks that he will not be in town   Pt requesting to speak to nurse Thank you

## 2016-02-08 NOTE — Telephone Encounter (Signed)
Pt called the office to speak with PCP regarding his previous call. Wanting to know if he can get a refill for Tramadol.  Thank you

## 2016-02-08 NOTE — Telephone Encounter (Signed)
Will route to PCP 

## 2016-02-09 NOTE — Telephone Encounter (Signed)
Will route to PCP 

## 2016-02-09 NOTE — Telephone Encounter (Signed)
Pt was called on 9/14 and informed of meds being ready.

## 2016-02-09 NOTE — Telephone Encounter (Signed)
Patient was provided with Rx for tramadol with 2 refills for 12/19/15  I have called his pharmacy, he picked up his last refill on 02/01/2016 and has one more to pick up I have informed patient's pharmacy that he will be gone for 6 weeks starting 02/15/16.   Plan: Please inform patient  I have informed his pharmacy that they can fill his tramadol Rx early.   He is still to take medication as prescribed. He will be provided with another refill to be filled on 04/02/2016.

## 2016-02-09 NOTE — Telephone Encounter (Signed)
[facility] is requesting medication refill for tramadol....patient is going out of town and will run out.  Please follow up

## 2016-04-13 ENCOUNTER — Ambulatory Visit: Payer: Medicaid Other | Attending: Family Medicine | Admitting: Family Medicine

## 2016-04-13 ENCOUNTER — Encounter: Payer: Self-pay | Admitting: Family Medicine

## 2016-04-13 DIAGNOSIS — M79605 Pain in left leg: Secondary | ICD-10-CM | POA: Insufficient documentation

## 2016-04-13 DIAGNOSIS — M50221 Other cervical disc displacement at C4-C5 level: Secondary | ICD-10-CM | POA: Diagnosis not present

## 2016-04-13 DIAGNOSIS — G4700 Insomnia, unspecified: Secondary | ICD-10-CM | POA: Insufficient documentation

## 2016-04-13 DIAGNOSIS — G8929 Other chronic pain: Secondary | ICD-10-CM | POA: Diagnosis not present

## 2016-04-13 DIAGNOSIS — Z8619 Personal history of other infectious and parasitic diseases: Secondary | ICD-10-CM | POA: Insufficient documentation

## 2016-04-13 DIAGNOSIS — M545 Low back pain: Secondary | ICD-10-CM | POA: Diagnosis present

## 2016-04-13 DIAGNOSIS — F17210 Nicotine dependence, cigarettes, uncomplicated: Secondary | ICD-10-CM | POA: Diagnosis not present

## 2016-04-13 DIAGNOSIS — G40909 Epilepsy, unspecified, not intractable, without status epilepticus: Secondary | ICD-10-CM | POA: Diagnosis not present

## 2016-04-13 DIAGNOSIS — M50223 Other cervical disc displacement at C6-C7 level: Secondary | ICD-10-CM | POA: Insufficient documentation

## 2016-04-13 DIAGNOSIS — M542 Cervicalgia: Secondary | ICD-10-CM | POA: Diagnosis not present

## 2016-04-13 DIAGNOSIS — F418 Other specified anxiety disorders: Secondary | ICD-10-CM | POA: Insufficient documentation

## 2016-04-13 DIAGNOSIS — M4802 Spinal stenosis, cervical region: Secondary | ICD-10-CM | POA: Diagnosis not present

## 2016-04-13 DIAGNOSIS — K219 Gastro-esophageal reflux disease without esophagitis: Secondary | ICD-10-CM | POA: Insufficient documentation

## 2016-04-13 DIAGNOSIS — M50222 Other cervical disc displacement at C5-C6 level: Secondary | ICD-10-CM | POA: Insufficient documentation

## 2016-04-13 DIAGNOSIS — M1990 Unspecified osteoarthritis, unspecified site: Secondary | ICD-10-CM | POA: Insufficient documentation

## 2016-04-13 DIAGNOSIS — I10 Essential (primary) hypertension: Secondary | ICD-10-CM | POA: Insufficient documentation

## 2016-04-13 MED ORDER — GABAPENTIN 100 MG PO CAPS: 100.0000 mg | ORAL_CAPSULE | Freq: Three times a day (TID) | ORAL | 3 refills | Status: DC

## 2016-04-13 MED ORDER — AMLODIPINE BESYLATE 10 MG PO TABS: 10.0000 mg | ORAL_TABLET | Freq: Every day | ORAL | 5 refills | Status: DC

## 2016-04-13 MED ORDER — NALOXEGOL OXALATE 12.5 MG PO TABS: 25.0000 mg | ORAL_TABLET | Freq: Every day | ORAL | 5 refills | Status: DC

## 2016-04-13 MED ORDER — TRAMADOL HCL 50 MG PO TABS: 50.0000 mg | ORAL_TABLET | Freq: Four times a day (QID) | ORAL | 2 refills | Status: DC | PRN

## 2016-04-13 NOTE — Progress Notes (Signed)
Subjective:  Patient ID: Daniel Adams, male    DOB: 07/08/1960  Age: 55 y.o. MRN: [id number]  CC: Back Pain   HPI Daniel Adams has hx of substance abuse ( ETOH, BZ), HTN, chronic pain, depression, anxiety, insomnia he presents for    1. Chronic low back pain: x 23 years. Lumbar pain radiating down both legs. He has severe back pain with sleep disturbance. He is taking seroquel and trazodone for his dpression. He is taking tramadol for pain control. He was most recently prescribed Vicodin for pain control at his last pain management. He was dismissed from pain management in 05/2015 due to positive cocaine UDS. He was evaluated by neurosurgery in 10/2015 who did not recommend surgical intervention.  He reports that his pain is not well controlled. He denies fecal and urinary incontinence. He takes movantik to prevent constipation.   Lumbar MRI 11/06/2012: IMPRESSION: Foraminal protrusion at L5-S1, left.  Correlate clinically for left leg pain.  Similar appearance to 2009.  2. Chronic neck pain with radicular symptoms down R arm:  Cervical MRI 12/14/2014: IMPRESSION:  Abnormal MRI cervical spine (without) demonstrating: 1. At C4-5: disc bulging, rightward disc protrusion and bilateral uncovertebral joint hypertrophy with severe right and moderate left foraminal stenosis  2. At C5-6: disc bulging, rightward uncovertebral joint hypertrophy and spurring with severe right foraminal stenosis  3. At C6-7: disc bulging, rightward uncovertebral joint hypertrophy with severe right foraminal stenosis  4. At C3-4: disc bulging, uncovertebral joint hypertrophy and facet hypertrophy with moderate biforaminal stenosis 5. At C7-T1: disc bulging and uncovertebral joint hypertrophy with mild right foraminal stenosis  3. Chronic HTN: taking norvasc 10 mg daily. No HA, CP, SOB or swelling.   Social History  Substance Use Topics  . Smoking status: Current Every Day Smoker    Types: Cigarettes  . Smokeless  tobacco: Never Used     Comment: Smoking 3-4 cigs/ day  . Alcohol use No     Comment: Former   Past Surgical History:  Procedure Laterality Date  . KNEE ARTHROSCOPY     Past Medical History:  Diagnosis Date  . Arthritis   . Depression   . GERD (gastroesophageal reflux disease)   . H. pylori infection    DX blood test   . Hypertension   . Seizure disorder [facility](HCC)     Outpatient Medications Prior to Visit  Medication Sig Dispense Refill  . amLODipine (NORVASC) 10 MG tablet Take 1 tablet (10 mg total) by mouth daily. 30 tablet 5  . hydrOXYzine (VISTARIL) 50 MG capsule Take 50 mg by mouth 3 (three) times daily as needed.    Marland Kitchen. LORazepam (ATIVAN) 1 MG tablet Take 1 mg by mouth daily.    . magnesium hydroxide (MILK OF MAGNESIA) 400 MG/5ML suspension Take 5 mLs by mouth daily as needed for mild constipation. 360 mL 0  . naloxegol oxalate (MOVANTIK) 12.5 MG TABS tablet Take 2 tablets (25 mg total) by mouth daily. 30 tablet 0  . neomycin-polymyxin-hydrocortisone (CORTISPORIN) otic solution Place 4 drops into the left ear 4 (four) times daily. 10 mL 0  . QUEtiapine (SEROQUEL) 100 MG tablet Take 100 mg by mouth.    . traMADol (ULTRAM) 50 MG tablet Take 1 tablet (50 mg total) by mouth every 6 (six) hours as needed. 120 tablet 2  . traZODone (DESYREL) 50 MG tablet Take 1 tablet (50 mg total) by mouth at bedtime. 30 tablet 5  . atorvastatin (LIPITOR) 40 MG tablet Take 1 tablet (  40 mg total) by mouth daily. (Patient not taking: Reported on 04/13/2016) 90 tablet 3   No facility-administered medications prior to visit.     ROS Review of Systems  Constitutional: Negative for chills, fatigue, fever and unexpected weight change.  Eyes: Negative for visual disturbance.  Respiratory: Negative for cough and shortness of breath.   Cardiovascular: Negative for chest pain, palpitations and leg swelling.  Gastrointestinal: Negative for abdominal pain, blood in stool, diarrhea, nausea and vomiting.    Endocrine: Negative for polydipsia, polyphagia and polyuria.  Musculoskeletal: Positive for arthralgias, back pain, gait problem and neck pain. Negative for myalgias.  Skin: Negative for rash.  Allergic/Immunologic: Negative for immunocompromised state.  Hematological: Negative for adenopathy. Does not bruise/bleed easily.  Psychiatric/Behavioral: Positive for dysphoric mood. Negative for sleep disturbance and suicidal ideas. The patient is nervous/anxious.     Objective:  BP (!) 145/90 (BP Location: Right Arm, Patient Position: Sitting, Cuff Size: Small)   Pulse 60   Temp 97.9 F (36.6 C) (Oral)   Ht 5\' 2"  (1.575 m)   Wt 144 lb (65.3 kg)   SpO2 100%   BMI 26.34 kg/m   BP/Weight 04/13/2016 12/19/2015 10/07/2015  Systolic BP 145 154 125  Diastolic BP 90 95 82  Wt. (Lbs) 144 148.4 149  BMI 26.34 27.14 27.25   Physical Exam  Constitutional: He appears well-developed and well-nourished. No distress.  HENT:  Head: Normocephalic and atraumatic.  Neck: Normal range of motion. Neck supple.  Pulmonary/Chest: Effort normal.  Musculoskeletal: He exhibits no edema.       Lumbar back: He exhibits decreased range of motion, tenderness, pain and spasm.  Neurological: He is alert.  Skin: Skin is warm and dry. No rash noted. No erythema.  Psychiatric: He has a normal mood and affect.   Depression screen [facility] 2/9 12/22/2015 10/07/2015 09/06/2015  Decreased Interest 1 1 3   Down, Depressed, Hopeless 1 1 3   PHQ - 2 Score 2 2 6   Altered sleeping 3 0 3  Tired, decreased energy 3 1 3   Change in appetite 3 1 3   Feeling bad or failure about yourself  0 0 1  Trouble concentrating 1 0 1  Moving slowly or fidgety/restless 0 0 3  Suicidal thoughts 0 0 0  PHQ-9 Score 12 4 20   Difficult doing work/chores - - -   GAD 7 : Generalized Anxiety Score 12/19/2015 10/07/2015 09/06/2015 07/18/2015  Nervous, Anxious, on Edge 1 1 1 3   Control/stop worrying 1 1 1 3   Worry too much - different things 2 0 1 3   Trouble relaxing 3 1 3 3   Restless 0 0 2 3  Easily annoyed or irritable 2 0 2 3  Afraid - awful might happen 0 0 1 1  Total GAD 7 Score 9 3 11 19      Assessment & Plan:   Daniel Adams was seen today for back pain.  Diagnoses and all orders for this visit:  Chronic bilateral low back pain without sciatica -     traMADol (ULTRAM) 50 MG tablet; Take 1 tablet (50 mg total) by mouth every 6 (six) hours as needed. -     naloxegol oxalate (MOVANTIK) 12.5 MG TABS tablet; Take 2 tablets (25 mg total) by mouth daily. -     Discontinue: gabapentin (NEURONTIN) 100 MG capsule; Take 1 capsule (100 mg total) by mouth 3 (three) times daily. -     gabapentin (NEURONTIN) 100 MG capsule; Take 1 capsule (100 mg total) by  mouth 3 (three) times daily.  Chronic cervical pain -     traMADol (ULTRAM) 50 MG tablet; Take 1 tablet (50 mg total) by mouth every 6 (six) hours as needed. -     naloxegol oxalate (MOVANTIK) 12.5 MG TABS tablet; Take 2 tablets (25 mg total) by mouth daily. -     Discontinue: gabapentin (NEURONTIN) 100 MG capsule; Take 1 capsule (100 mg total) by mouth 3 (three) times daily. -     gabapentin (NEURONTIN) 100 MG capsule; Take 1 capsule (100 mg total) by mouth 3 (three) times daily.  Essential hypertension -     amLODipine (NORVASC) 10 MG tablet; Take 1 tablet (10 mg total) by mouth daily.   Follow-up: No Follow-up on file.   Dessa PhiJosalyn Rona Tomson MD

## 2016-04-13 NOTE — Patient Instructions (Addendum)
Diagnoses and all orders for this visit:  Chronic bilateral low back pain without sciatica -     traMADol (ULTRAM) 50 MG tablet; Take 1 tablet (50 mg total) by mouth every 6 (six) hours as needed. -     naloxegol oxalate (MOVANTIK) 12.5 MG TABS tablet; Take 2 tablets (25 mg total) by mouth daily. -     Discontinue: gabapentin (NEURONTIN) 100 MG capsule; Take 1 capsule (100 mg total) by mouth 3 (three) times daily. -     gabapentin (NEURONTIN) 100 MG capsule; Take 1 capsule (100 mg total) by mouth 3 (three) times daily.  Chronic cervical pain -     traMADol (ULTRAM) 50 MG tablet; Take 1 tablet (50 mg total) by mouth every 6 (six) hours as needed. -     naloxegol oxalate (MOVANTIK) 12.5 MG TABS tablet; Take 2 tablets (25 mg total) by mouth daily. -     Discontinue: gabapentin (NEURONTIN) 100 MG capsule; Take 1 capsule (100 mg total) by mouth 3 (three) times daily. -     gabapentin (NEURONTIN) 100 MG capsule; Take 1 capsule (100 mg total) by mouth 3 (three) times daily.  Essential hypertension -     amLODipine (NORVASC) 10 MG tablet; Take 1 tablet (10 mg total) by mouth daily.   F/u in 4 weeks for chronic back/f/u response to gabapentin   Dr. Armen PickupFunches

## 2016-04-13 NOTE — Progress Notes (Signed)
pt is here today for medication refills.  Pt declined flu shot.

## 2016-04-14 ENCOUNTER — Encounter: Payer: Self-pay | Admitting: Family Medicine

## 2016-04-14 NOTE — Assessment & Plan Note (Signed)
Chronic HTN Med: compliant Refilled norvasc 10 mg daily

## 2016-04-14 NOTE — Assessment & Plan Note (Signed)
Chronic cervical pain with radicular symptoms down R am Refill tramadol Add gabapentin

## 2016-04-14 NOTE — Assessment & Plan Note (Signed)
Chronic low back pain with radicular symptoms Refilled tramadol Add gabapentin

## 2016-05-08 ENCOUNTER — Encounter: Payer: Self-pay | Admitting: Family Medicine

## 2016-05-08 ENCOUNTER — Ambulatory Visit: Payer: Medicaid Other | Attending: Family Medicine | Admitting: Family Medicine

## 2016-05-08 VITALS — BP 117/73 | HR 70 | Temp 97.7°F | Ht 62.0 in | Wt 146.0 lb

## 2016-05-08 DIAGNOSIS — M549 Dorsalgia, unspecified: Secondary | ICD-10-CM | POA: Diagnosis present

## 2016-05-08 DIAGNOSIS — F17210 Nicotine dependence, cigarettes, uncomplicated: Secondary | ICD-10-CM | POA: Diagnosis not present

## 2016-05-08 DIAGNOSIS — F329 Major depressive disorder, single episode, unspecified: Secondary | ICD-10-CM | POA: Diagnosis not present

## 2016-05-08 DIAGNOSIS — G4700 Insomnia, unspecified: Secondary | ICD-10-CM | POA: Diagnosis not present

## 2016-05-08 DIAGNOSIS — G8929 Other chronic pain: Secondary | ICD-10-CM | POA: Insufficient documentation

## 2016-05-08 DIAGNOSIS — Z79899 Other long term (current) drug therapy: Secondary | ICD-10-CM | POA: Insufficient documentation

## 2016-05-08 DIAGNOSIS — M545 Low back pain: Secondary | ICD-10-CM | POA: Insufficient documentation

## 2016-05-08 DIAGNOSIS — F419 Anxiety disorder, unspecified: Secondary | ICD-10-CM | POA: Insufficient documentation

## 2016-05-08 DIAGNOSIS — I10 Essential (primary) hypertension: Secondary | ICD-10-CM | POA: Diagnosis not present

## 2016-05-08 DIAGNOSIS — F418 Other specified anxiety disorders: Secondary | ICD-10-CM | POA: Diagnosis not present

## 2016-05-08 MED ORDER — HYDROXYZINE HCL 25 MG PO TABS: 25.0000 mg | ORAL_TABLET | Freq: Every day | ORAL | 0 refills | Status: DC

## 2016-05-08 NOTE — Patient Instructions (Addendum)
Daniel Adams was seen today for back pain.  Diagnoses and all orders for this visit:  Depression with anxiety -     hydrOXYzine (ATARAX/VISTARIL) 25 MG tablet; Take 1-2 tablets (25-50 mg total) by mouth daily.   1 month trial of atarax instead of ativan   Avoid taking medication that is not prescribed Please take home, review and sign the controlled substance contract. This is needed to continue tramadol and for me to take over ativan we both decide that is the best course of action  F/u in 1 month for chronic pain and anxiety  Dr. Armen PickupFunches

## 2016-05-08 NOTE — Progress Notes (Signed)
Subjective:  Patient ID: Daniel Adams, male    DOB: 05/26/1961  Age: 55 y.o. MRN: [id number]  CC: Back Pain   HPI Daniel Adams has hx of substance abuse ( ETOH, BZ), HTN, chronic pain, depression, anxiety, insomnia he presents for    1. Chronic low back pain: x 23 years. Lumbar pain radiating down both legs. He has severe back pain with sleep disturbance. He is taking seroquel and trazodone for his dpression. He is taking tramadol for pain control. He was most recently prescribed Vicodin for pain control at his last pain management. He was dismissed from pain management in 05/2015 due to positive cocaine UDS. He was evaluated by neurosurgery in 10/2015 who did not recommend surgical intervention.  He takes tramadol for pain. He reports that his pain is not well controlled. He denies fecal and urinary incontinence. He takes movantik to prevent constipation.   Lumbar MRI 11/06/2012: IMPRESSION: Foraminal protrusion at L5-S1, left.  Correlate clinically for left leg pain.  Similar appearance to 2009.  2. Chronic neck pain with radicular symptoms down R arm:  Cervical MRI 12/14/2014: IMPRESSION:  Abnormal MRI cervical spine (without) demonstrating: 1. At C4-5: disc bulging, rightward disc protrusion and bilateral uncovertebral joint hypertrophy with severe right and moderate left foraminal stenosis  2. At C5-6: disc bulging, rightward uncovertebral joint hypertrophy and spurring with severe right foraminal stenosis  3. At C6-7: disc bulging, rightward uncovertebral joint hypertrophy with severe right foraminal stenosis  4. At C3-4: disc bulging, uncovertebral joint hypertrophy and facet hypertrophy with moderate biforaminal stenosis 5. At C7-T1: disc bulging and uncovertebral joint hypertrophy with mild right foraminal stenosis  3. Chronic anxiety: he takes ativan and seroquel. [facility] willing to prescribe ativan 1 mg. He reports he tried other  treatment for anxiety without control. He reports that he used a friend's methadone recently.   Social History  Substance Use Topics  . Smoking status: Current Every Day Smoker    Types: Cigarettes  . Smokeless tobacco: Never Used     Comment: Smoking 3-4 cigs/ day  . Alcohol use No     Comment: Former   Past Surgical History:  Procedure Laterality Date  . KNEE ARTHROSCOPY     Past Medical History:  Diagnosis Date  . Arthritis   . Depression   . GERD (gastroesophageal reflux disease)   . H. pylori infection    DX blood test   . Hypertension   . Seizure disorder (HCC)    alcohol withdrawal seizures     Outpatient Medications Prior to Visit  Medication Sig Dispense Refill  . amLODipine (NORVASC) 10 MG tablet Take 1 tablet (10 mg total) by mouth daily. 30 tablet 5  . gabapentin (NEURONTIN) 100 MG capsule Take 1 capsule (100 mg total) by mouth 3 (three) times daily. 90 capsule 3  . LORazepam (ATIVAN) 1 MG tablet Take 1 mg by mouth daily.    . magnesium hydroxide (MILK OF MAGNESIA) 400 MG/5ML suspension Take 5 mLs by mouth daily as needed for mild constipation. 360 mL 0  . naloxegol oxalate (MOVANTIK) 12.5 MG TABS tablet Take 2 tablets (25 mg total) by mouth daily. 30 tablet 5  . QUEtiapine (SEROQUEL) 100 MG tablet Take 100 mg by mouth.    . traMADol (ULTRAM) 50 MG tablet Take 1 tablet (50 mg total) by mouth every 6 (six) hours as needed. 120 tablet 2  . hydrOXYzine (VISTARIL) 50 MG capsule Take  50 mg by mouth 3 (three) times daily as needed.    . neomycin-polymyxin-hydrocortisone (CORTISPORIN) otic solution Place 4 drops into the left ear 4 (four) times daily. (Patient not taking: Reported on 05/08/2016) 10 mL 0  . traZODone (DESYREL) 50 MG tablet Take 1 tablet (50 mg total) by mouth at bedtime. (Patient not taking: Reported on 05/08/2016) 30 tablet 5   No facility-administered medications prior to visit.     ROS Review of Systems  Constitutional: Negative for chills,  fatigue, fever and unexpected weight change.  Eyes: Negative for visual disturbance.  Respiratory: Negative for cough and shortness of breath.   Cardiovascular: Negative for chest pain, palpitations and leg swelling.  Gastrointestinal: Negative for abdominal pain, blood in stool, diarrhea, nausea and vomiting.  Endocrine: Negative for polydipsia, polyphagia and polyuria.  Musculoskeletal: Positive for arthralgias, back pain, gait problem and neck pain. Negative for myalgias.  Skin: Negative for rash.  Allergic/Immunologic: Negative for immunocompromised state.  Hematological: Negative for adenopathy. Does not bruise/bleed easily.  Psychiatric/Behavioral: Positive for dysphoric mood. Negative for sleep disturbance and suicidal ideas. The patient is nervous/anxious.     Objective:  BP 117/73 (BP Location: Left Arm, Patient Position: Sitting, Cuff Size: Small)   Pulse 70   Temp 97.7 F (36.5 C) (Oral)   Ht 5\' 2"  (1.575 m)   Wt 146 lb (66.2 kg)   SpO2 97%   BMI 26.70 kg/m   BP/Weight 05/08/2016 04/13/2016 12/19/2015  Systolic BP 117 145 154  Diastolic BP 73 90 95  Wt. (Lbs) 146 144 148.4  BMI 26.7 26.34 27.14   Physical Exam  Constitutional: He appears well-developed and well-nourished. No distress.  HENT:  Head: Normocephalic and atraumatic.  Neck: Normal range of motion. Neck supple.  Pulmonary/Chest: Effort normal.  Musculoskeletal: He exhibits no edema.       Lumbar back: He exhibits decreased range of motion, tenderness, pain and spasm.  Neurological: He is alert.  Skin: Skin is warm and dry. No rash noted. No erythema.  Psychiatric: He has a normal mood and affect.   [facility] 2/9 05/08/2016 04/13/2016 12/22/2015  Decreased Interest 3 0 1  Down, Depressed, Hopeless 3 3 1   PHQ - 2 Score 6 3 2   Altered sleeping 3 3 3   Tired, decreased energy 3 3 3   Change in appetite 2 2 3   Feeling bad or failure about yourself  1 3 0  Trouble concentrating 1 0 1  Moving  slowly or fidgety/restless 1 2 0  Suicidal thoughts 0 0 0  PHQ-9 Score 17 16 12   Difficult doing work/chores - - -   GAD 7 : Generalized Anxiety Score 05/08/2016 04/13/2016 12/19/2015 10/07/2015  Nervous, Anxious, on Edge 3 3 1 1   Control/stop worrying 2 2 1 1   Worry too much - different things 2 2 2  0  Trouble relaxing 3 3 3 1   Restless 0 0 0 0  Easily annoyed or irritable 3 2 2  0  Afraid - awful might happen 1 0 0 0  Total GAD 7 Score 14 12 9 3      Assessment & Plan:  Daniel Adams was seen today for back pain.  Diagnoses and all orders for this visit:  Depression with anxiety -     hydrOXYzine (ATARAX/VISTARIL) 25 MG tablet; Take 1-2 tablets (25-50 mg total) by mouth daily.   There are no diagnoses linked to this encounter. Follow-up: No Follow-up on file.   Dessa PhiJosalyn Deetra Booton MD

## 2016-05-08 NOTE — Progress Notes (Signed)
Pt is here for back pain, pt wants a referral to pain management. Pt wants to know if you can prescribe him ativan

## 2016-05-10 NOTE — Assessment & Plan Note (Signed)
Chronic Treated at [facility] Atarax ordered to replace ativan

## 2016-07-26 ENCOUNTER — Telehealth: Payer: Self-pay | Admitting: Family Medicine

## 2016-07-26 ENCOUNTER — Ambulatory Visit: Payer: Medicaid Other | Attending: Family Medicine | Admitting: Family Medicine

## 2016-07-26 ENCOUNTER — Encounter: Payer: Self-pay | Admitting: Family Medicine

## 2016-07-26 VITALS — BP 151/95 | HR 84 | Temp 98.5°F | Resp 18 | Ht 62.0 in | Wt 149.0 lb

## 2016-07-26 DIAGNOSIS — G8929 Other chronic pain: Secondary | ICD-10-CM | POA: Insufficient documentation

## 2016-07-26 DIAGNOSIS — M5440 Lumbago with sciatica, unspecified side: Secondary | ICD-10-CM | POA: Insufficient documentation

## 2016-07-26 DIAGNOSIS — K219 Gastro-esophageal reflux disease without esophagitis: Secondary | ICD-10-CM | POA: Diagnosis not present

## 2016-07-26 DIAGNOSIS — G4700 Insomnia, unspecified: Secondary | ICD-10-CM | POA: Diagnosis not present

## 2016-07-26 DIAGNOSIS — F17210 Nicotine dependence, cigarettes, uncomplicated: Secondary | ICD-10-CM | POA: Insufficient documentation

## 2016-07-26 DIAGNOSIS — Z87898 Personal history of other specified conditions: Secondary | ICD-10-CM | POA: Diagnosis not present

## 2016-07-26 DIAGNOSIS — F1911 Other psychoactive substance abuse, in remission: Secondary | ICD-10-CM | POA: Diagnosis not present

## 2016-07-26 DIAGNOSIS — F419 Anxiety disorder, unspecified: Secondary | ICD-10-CM | POA: Diagnosis not present

## 2016-07-26 DIAGNOSIS — G40909 Epilepsy, unspecified, not intractable, without status epilepticus: Secondary | ICD-10-CM | POA: Insufficient documentation

## 2016-07-26 DIAGNOSIS — I10 Essential (primary) hypertension: Secondary | ICD-10-CM | POA: Insufficient documentation

## 2016-07-26 DIAGNOSIS — Z79899 Other long term (current) drug therapy: Secondary | ICD-10-CM | POA: Diagnosis not present

## 2016-07-26 DIAGNOSIS — M542 Cervicalgia: Secondary | ICD-10-CM | POA: Diagnosis not present

## 2016-07-26 DIAGNOSIS — F329 Major depressive disorder, single episode, unspecified: Secondary | ICD-10-CM | POA: Insufficient documentation

## 2016-07-26 DIAGNOSIS — M545 Low back pain: Secondary | ICD-10-CM

## 2016-07-26 DIAGNOSIS — F418 Other specified anxiety disorders: Secondary | ICD-10-CM

## 2016-07-26 MED ORDER — AMLODIPINE BESYLATE 10 MG PO TABS: 10.0000 mg | ORAL_TABLET | Freq: Every day | ORAL | 5 refills | Status: DC

## 2016-07-26 MED ORDER — TRAMADOL HCL 50 MG PO TABS: 50.0000 mg | ORAL_TABLET | Freq: Four times a day (QID) | ORAL | 0 refills | Status: DC | PRN

## 2016-07-26 NOTE — Patient Instructions (Addendum)
Shafter was seen today for anxiety and pain.  Diagnoses and all orders for this visit:  History of substance abuse -     Pain Mgmt, Profile 1 w/o Conf, U  Chronic cervical pain -     Ambulatory referral to Pain Clinic -     traMADol (ULTRAM) 50 MG tablet; Take 1 tablet (50 mg total) by mouth every 6 (six) hours as needed.  Chronic bilateral low back pain with sciatica, sciatica laterality unspecified -     Ambulatory referral to Pain Clinic  Chronic bilateral low back pain without sciatica -     traMADol (ULTRAM) 50 MG tablet; Take 1 tablet (50 mg total) by mouth every 6 (six) hours as needed.  Essential hypertension -     amLODipine (NORVASC) 10 MG tablet; Take 1 tablet (10 mg total) by mouth daily.   I have placed a referral to pain management Be sure to take your amlodipine when you get home. Refilled tramadol today  F/u in 5-6 weeks [place] and chronic pain  Dr. Armen PickupFunches

## 2016-07-26 NOTE — Assessment & Plan Note (Signed)
Elevated BP today. Patient reports that he did not take his medication today.  Plan: Continue amlodipine 10 mg daily

## 2016-07-26 NOTE — Telephone Encounter (Signed)
Dr Armen Pickupfunches was informed of this and states that ok to cancel the referral

## 2016-07-26 NOTE — Progress Notes (Signed)
Subjective:  Patient ID: Daniel Adams, male    DOB: 09-17-1960  Age: 56 y.o. MRN: [id number]  CC: Anxiety and Pain   HPI Daniel Adams has hx of substance abuse ( ETOH, BZ), HTN, chronic pain, depression, anxiety, insomnia he presents for    1. Chronic low back pain: x 23 years. Lumbar pain radiating down both legs. He has severe back pain with sleep disturbance. He is taking seroquel and trazodone for his dpression. He is taking tramadol for pain control. He was most recently prescribed Vicodin for pain control at his last pain management. He was dismissed from pain management in 05/2015 due to positive cocaine UDS. He was evaluated by neurosurgery in 10/2015 who did not recommend surgical intervention.  He takes tramadol for pain. He reports that his pain is not well controlled. He denies fecal and urinary incontinence. He takes movantik to prevent constipation.   Lumbar MRI 11/06/2012: IMPRESSION: Foraminal protrusion at L5-S1, left.  Correlate clinically for left leg pain.  Similar appearance to 2009.  2. Chronic neck pain with radicular symptoms down R arm:  Patient had established with pain management with Dr. Ronita Hipps. He was prescribed oxycodone 5 mg on 07/04/2016, # 60. He went for follow up appointment and did not have his medications. He reported that he misplaced 10 pills, he did not remember to bring his medications. He was immediately discharged from Dr. Milta Deiters clinic. He presents today with severe pain. He took few tramadol he had left over yesterday. He request refill. He reports that he may have oxycodone, tramadol and ativan in his system..   Cervical MRI 12/14/2014: IMPRESSION:  Abnormal MRI cervical spine (without) demonstrating: 1. At C4-5: disc bulging, rightward disc protrusion and bilateral uncovertebral joint hypertrophy with severe right and moderate left foraminal stenosis  2. At C5-6: disc bulging, rightward uncovertebral joint hypertrophy and spurring with severe  right foraminal stenosis  3. At C6-7: disc bulging, rightward uncovertebral joint hypertrophy with severe right foraminal stenosis  4. At C3-4: disc bulging, uncovertebral joint hypertrophy and facet hypertrophy with moderate biforaminal stenosis 5. At C7-T1: disc bulging and uncovertebral joint hypertrophy with mild right foraminal stenosis  3. Chronic anxiety: he takes ativan and seroquel. Today he reports that his psychiatrist at  [facility] has continued to refill ativan. Review of [place] reveals that he last filled ativan 1 mg  prescribed by Dr. Jillene Bucks on 04/13/2016. 30 pills, no additional refills. He denies taking medication that has not been prescribed.   4. Chronic HTN: no medication this AM. Chronic smoker. Denies HA, CP, SOB or leg swelling.   Social History  Substance Use Topics  . Smoking status: Current Every Day Smoker    Types: Cigarettes  . Smokeless tobacco: Never Used     Comment: Smoking 3-4 cigs/ day  . Alcohol use No     Comment: Former   Past Surgical History:  Procedure Laterality Date  . KNEE ARTHROSCOPY     Past Medical History:  Diagnosis Date  . Arthritis   . Depression   . GERD (gastroesophageal reflux disease)   . H. pylori infection    DX blood test   . Hypertension   . Seizure disorder (HCC)    alcohol withdrawal seizures     Outpatient Medications Prior to Visit  Medication Sig Dispense Refill  . amLODipine (NORVASC) 10 MG tablet Take 1 tablet (10 mg total) by mouth daily. 30 tablet 5  . gabapentin (NEURONTIN) 100 MG capsule Take 1  capsule (100 mg total) by mouth 3 (three) times daily. 90 capsule 3  . hydrOXYzine (ATARAX/VISTARIL) 25 MG tablet Take 1-2 tablets (25-50 mg total) by mouth daily. 60 tablet 0  . hydrOXYzine (VISTARIL) 50 MG capsule Take 50 mg by mouth 3 (three) times daily as needed.    Marland Kitchen LORazepam (ATIVAN) 1 MG tablet Take 1 mg by mouth daily.    . magnesium hydroxide (MILK OF MAGNESIA) 400 MG/5ML suspension Take 5 mLs by  mouth daily as needed for mild constipation. 360 mL 0  . naloxegol oxalate (MOVANTIK) 12.5 MG TABS tablet Take 2 tablets (25 mg total) by mouth daily. 30 tablet 5  . neomycin-polymyxin-hydrocortisone (CORTISPORIN) otic solution Place 4 drops into the left ear 4 (four) times daily. (Patient not taking: Reported on 05/08/2016) 10 mL 0  . QUEtiapine (SEROQUEL) 100 MG tablet Take 100 mg by mouth.    . traMADol (ULTRAM) 50 MG tablet Take 1 tablet (50 mg total) by mouth every 6 (six) hours as needed. 120 tablet 2  . traZODone (DESYREL) 50 MG tablet Take 1 tablet (50 mg total) by mouth at bedtime. (Patient not taking: Reported on 05/08/2016) 30 tablet 5   No facility-administered medications prior to visit.     ROS Review of Systems  Constitutional: Negative for chills, fatigue, fever and unexpected weight change.  Eyes: Negative for visual disturbance.  Respiratory: Negative for cough and shortness of breath.   Cardiovascular: Negative for chest pain, palpitations and leg swelling.  Gastrointestinal: Negative for abdominal pain, blood in stool, diarrhea, nausea and vomiting.  Endocrine: Negative for polydipsia, polyphagia and polyuria.  Musculoskeletal: Positive for arthralgias, back pain, gait problem and neck pain. Negative for myalgias.  Skin: Negative for rash.  Allergic/Immunologic: Negative for immunocompromised state.  Hematological: Negative for adenopathy. Does not bruise/bleed easily.  Psychiatric/Behavioral: Positive for dysphoric mood. Negative for sleep disturbance and suicidal ideas. The patient is nervous/anxious.     Objective:  BP (!) 151/95 (BP Location: Right Arm, Patient Position: Sitting, Cuff Size: Normal)   Pulse 84   Temp 98.5 F (36.9 C) (Oral)   Resp 18   Ht 5\' 2"  (1.575 m)   Wt 149 lb (67.6 kg)   SpO2 100%   BMI 27.25 kg/m   BP/Weight 07/26/2016 05/08/2016 04/13/2016  Systolic BP 151 117 145  Diastolic BP 95 73 90  Wt. (Lbs) 149 146 144  BMI 27.25 26.7  26.34   Physical Exam  Constitutional: He appears well-developed and well-nourished. No distress.  HENT:  Head: Normocephalic and atraumatic.  Neck: Normal range of motion. Neck supple.  Pulmonary/Chest: Effort normal.  Musculoskeletal: He exhibits no edema.       Lumbar back: He exhibits decreased range of motion, tenderness, pain and spasm.  Neurological: He is alert.  Skin: Skin is warm and dry. No rash noted. No erythema.  Psychiatric: He has a normal mood and affect.   Depression screen [facility] 2/9 05/08/2016 04/13/2016 12/22/2015  Decreased Interest 3 0 1  Down, Depressed, Hopeless 3 3 1   PHQ - 2 Score 6 3 2   Altered sleeping 3 3 3   Tired, decreased energy 3 3 3   Change in appetite 2 2 3   Feeling bad or failure about yourself  1 3 0  Trouble concentrating 1 0 1  Moving slowly or fidgety/restless 1 2 0  Suicidal thoughts 0 0 0  PHQ-9 Score 17 16 12   Difficult doing work/chores - - -   GAD 7 : Generalized Anxiety  Score 05/08/2016 04/13/2016 12/19/2015 10/07/2015  Nervous, Anxious, on Edge 3 3 1 1   Control/stop worrying 2 2 1 1   Worry too much - different things 2 2 2  0  Trouble relaxing 3 3 3 1   Restless 0 0 0 0  Easily annoyed or irritable 3 2 2  0  Afraid - awful might happen 1 0 0 0  Total GAD 7 Score 14 12 9 3      Assessment & Plan:  Daniel Adams was seen today for anxiety and pain.  Diagnoses and all orders for this visit:  History of substance abuse -     Pain Mgmt, Profile 1 w/o Conf, U  Chronic cervical pain -     Ambulatory referral to Pain Clinic  Chronic bilateral low back pain with sciatica, sciatica laterality unspecified -     Ambulatory referral to Pain Clinic  Chronic bilateral low back pain without sciatica   There are no diagnoses linked to this encounter. Follow-up: No Follow-up on file.   Dessa PhiJosalyn Bryler Dibble MD

## 2016-07-26 NOTE — Assessment & Plan Note (Signed)
Chronic pain Patient discharged from multiple pain management clinics due to lack of adherence to pill counts or substance abuse  Plan: UDS Refilled tramadol Placed another pain management referral

## 2016-07-26 NOTE — Telephone Encounter (Signed)
Good Afternoon  Dr Armen PickupFunches  I wanted to let you know that I already sent the patients to 10 pain clinics  the last one was 05-29-16 and he had an appointment 07-04-16 @ 3pm Dr Aggie CosierFarooque SA Khan . The information are in the referral notes .   I'm going to cancel the referral Thank You.

## 2016-07-26 NOTE — Assessment & Plan Note (Signed)
Chronic  Followed by [facility] I will not take over ativan given patient's inconsistent report  Continue current regimen

## 2016-07-28 LAB — PAIN MGMT, PROFILE 1 W/O CONF, U
AMPHETAMINES: NEGATIVE ng/mL (ref <500)
Barbiturates: NEGATIVE ng/mL (ref <300)
Benzodiazepines: POSITIVE ng/mL — AB (ref <100)
Cocaine Metabolite: NEGATIVE ng/mL (ref <150)
Creatinine: 71.4 mg/dL (ref >=20.0)
MARIJUANA METABOLITE: POSITIVE ng/mL — AB (ref <20)
Methadone Metabolite: NEGATIVE ng/mL (ref <100)
OXIDANT: NEGATIVE ug/mL (ref <200)
Opiates: POSITIVE ng/mL — AB (ref <100)
Oxycodone: POSITIVE ng/mL — AB (ref <100)
PH: 6.93 (ref 4.5–9.0)
PHENCYCLIDINE: NEGATIVE ng/mL (ref <25)

## 2016-08-08 ENCOUNTER — Telehealth: Payer: Self-pay

## 2016-08-08 NOTE — Telephone Encounter (Signed)
Pt was called and Armando ReichertCara Rogers was given the lab results for patient.

## 2016-08-08 NOTE — Telephone Encounter (Signed)
Pt was called and a VM was left informing pt to return phone call for lab results. 

## 2016-09-14 ENCOUNTER — Telehealth: Payer: Self-pay | Admitting: Family Medicine

## 2016-09-14 NOTE — Telephone Encounter (Signed)
Patient called the office asking to speak with nurse regarding a drug screening that he took a couple weeks back. Pt was informed that he didn't pass but would like more information as to why. Pt stated that if he doesn't pick up to leave a detailed message with wife. Please follow up.  Thank you.

## 2016-09-19 NOTE — Telephone Encounter (Signed)
Pt. Wife called requesting to speak with the nurse regarding pt. Drug screening that he had done a couple of week ago. Please f/u.

## 2016-09-19 NOTE — Telephone Encounter (Signed)
Pt's wife was called and informed of UDS results .

## 2016-10-01 ENCOUNTER — Ambulatory Visit: Payer: Medicaid Other | Admitting: Family Medicine

## 2016-10-10 ENCOUNTER — Encounter: Payer: Self-pay | Admitting: Family Medicine

## 2016-10-19 ENCOUNTER — Ambulatory Visit: Payer: Medicaid Other | Admitting: Family Medicine

## 2016-12-19 ENCOUNTER — Encounter: Payer: Self-pay | Admitting: Internal Medicine

## 2016-12-19 ENCOUNTER — Ambulatory Visit: Payer: Medicaid Other | Attending: Internal Medicine | Admitting: Internal Medicine

## 2016-12-19 ENCOUNTER — Other Ambulatory Visit: Payer: Self-pay

## 2016-12-19 ENCOUNTER — Ambulatory Visit (HOSPITAL_COMMUNITY)
Admission: RE | Admit: 2016-12-19 | Discharge: 2016-12-19 | Disposition: A | Discharge status: Home / Self Care | Payer: Medicaid Other | Source: Ambulatory Visit | Attending: Internal Medicine | Admitting: Internal Medicine

## 2016-12-19 VITALS — BP 169/110 | HR 101 | Temp 98.2°F | Resp 18 | Ht 62.0 in | Wt 147.6 lb

## 2016-12-19 DIAGNOSIS — M1990 Unspecified osteoarthritis, unspecified site: Secondary | ICD-10-CM | POA: Diagnosis not present

## 2016-12-19 DIAGNOSIS — M5440 Lumbago with sciatica, unspecified side: Secondary | ICD-10-CM | POA: Diagnosis not present

## 2016-12-19 DIAGNOSIS — I10 Essential (primary) hypertension: Secondary | ICD-10-CM | POA: Insufficient documentation

## 2016-12-19 DIAGNOSIS — Z8619 Personal history of other infectious and parasitic diseases: Secondary | ICD-10-CM | POA: Diagnosis not present

## 2016-12-19 DIAGNOSIS — K219 Gastro-esophageal reflux disease without esophagitis: Secondary | ICD-10-CM | POA: Insufficient documentation

## 2016-12-19 DIAGNOSIS — M5442 Lumbago with sciatica, left side: Secondary | ICD-10-CM | POA: Insufficient documentation

## 2016-12-19 DIAGNOSIS — Z888 Allergy status to other drugs, medicaments and biological substances status: Secondary | ICD-10-CM | POA: Insufficient documentation

## 2016-12-19 DIAGNOSIS — G40909 Epilepsy, unspecified, not intractable, without status epilepticus: Secondary | ICD-10-CM | POA: Diagnosis not present

## 2016-12-19 DIAGNOSIS — M545 Low back pain: Secondary | ICD-10-CM

## 2016-12-19 DIAGNOSIS — F418 Other specified anxiety disorders: Secondary | ICD-10-CM | POA: Diagnosis not present

## 2016-12-19 DIAGNOSIS — G8929 Other chronic pain: Secondary | ICD-10-CM | POA: Insufficient documentation

## 2016-12-19 DIAGNOSIS — G4700 Insomnia, unspecified: Secondary | ICD-10-CM | POA: Insufficient documentation

## 2016-12-19 DIAGNOSIS — M542 Cervicalgia: Secondary | ICD-10-CM

## 2016-12-19 MED ORDER — NORTRIPTYLINE HCL 25 MG PO CAPS: 25.0000 mg | ORAL_CAPSULE | Freq: Every day | ORAL | 3 refills | Status: DC

## 2016-12-19 MED ORDER — TRAMADOL HCL 50 MG PO TABS: 50.0000 mg | ORAL_TABLET | Freq: Four times a day (QID) | ORAL | 0 refills | Status: DC | PRN

## 2016-12-19 MED ORDER — HYDROXYZINE HCL 25 MG PO TABS: 25.0000 mg | ORAL_TABLET | Freq: Every day | ORAL | 3 refills | Status: DC

## 2016-12-19 MED ORDER — AMLODIPINE BESYLATE 10 MG PO TABS: 10.0000 mg | ORAL_TABLET | Freq: Every day | ORAL | 5 refills | Status: DC

## 2016-12-19 MED ORDER — GABAPENTIN 100 MG PO CAPS: 100.0000 mg | ORAL_CAPSULE | Freq: Three times a day (TID) | ORAL | 3 refills | Status: DC

## 2016-12-19 NOTE — Patient Instructions (Signed)

## 2016-12-19 NOTE — Progress Notes (Signed)
Daniel StandardSeyed Climer, is a 56 y.o. male  ZOX:[id number]:[id number]  [id number]  DOB - 01/25/1961  Chief Complaint  Patient presents with  . Back Pain      Subjective:   Daniel Adams is a [age] y.o. male with hx of substance abuse ( ETOH, BZ), HTN, chronic pain, depression, anxiety, and insomnia here today for a follow up visit and medication management. He is on Seroquel and trazodone for depression, and on Tramadol for pian but he was recently taken off because he violated his pain contract with multiple drugs positive in his urine including marijuana, benzodiazepines, opiates, and oxycodone. Patient is here today to plead for another chance, he claims he is clean. He has not sought prescription from any other provider for almost 3 months, he is miserable, constantly in pain and agony, unable to sleep. He is present here today with his wife who agrees with his story. Patient has No headache, No chest pain, No abdominal pain - No Nausea, No new weakness tingling or numbness, No Cough - SOB.  Problem  Chronic Bilateral Low Back Pain With Sciatica  Essential Hypertension   ALLERGIES: Allergies  Allergen Reactions  . Wellbutrin [Bupropion Hcl]     PAST MEDICAL HISTORY: Past Medical History:  Diagnosis Date  . Arthritis   . Depression   . GERD (gastroesophageal reflux disease)   . H. pylori infection    DX blood test   . Hypertension   . Seizure disorder (HCC)    alcohol withdrawal seizures     MEDICATIONS AT HOME: Prior to Admission medications   Medication Sig Start Date End Date Taking? Authorizing Provider  amLODipine (NORVASC) 10 MG tablet Take 1 tablet (10 mg total) by mouth daily. 12/19/16  Yes Quentin AngstJegede, Connar Keating E, MD  magnesium hydroxide (MILK OF MAGNESIA) 400 MG/5ML suspension Take 5 mLs by mouth daily as needed for mild constipation. 04/13/14  Yes Advani, Ayesha Rumpfeepak, MD  naloxegol oxalate (MOVANTIK) 12.5 MG TABS tablet Take 2 tablets (25 mg total) by mouth daily. 04/13/16  Yes Funches,  Josalyn, MD  QUEtiapine (SEROQUEL) 100 MG tablet Take 100 mg by mouth.   Yes [provider]  gabapentin (NEURONTIN) 100 MG capsule Take 1 capsule (100 mg total) by mouth 3 (three) times daily. 12/19/16   Quentin AngstJegede, Micaylah Bertucci E, MD  hydrOXYzine (ATARAX/VISTARIL) 25 MG tablet Take 1-2 tablets (25-50 mg total) by mouth daily. 12/19/16   Quentin AngstJegede, Jhett Fretwell E, MD  neomycin-polymyxin-hydrocortisone (CORTISPORIN) otic solution Place 4 drops into the left ear 4 (four) times daily. Patient not taking: Reported on 05/08/2016 12/19/15   Dessa PhiFunches, Josalyn, MD  nortriptyline (PAMELOR) 25 MG capsule Take 1 capsule (25 mg total) by mouth at bedtime. 12/19/16   Quentin AngstJegede, Melisha Eggleton E, MD  traMADol (ULTRAM) 50 MG tablet Take 1 tablet (50 mg total) by mouth every 6 (six) hours as needed. 12/19/16   Quentin AngstJegede, Varsha Knock E, MD    Objective:   Vitals:   12/19/16 1341  BP: (!) 169/110  Pulse: (!) 101  Resp: 18  Temp: 98.2 F (36.8 C)  TempSrc: Oral  SpO2: 100%  Weight: 147 lb 9.6 oz (67 kg)  Height: 5\' 2"  (1.575 m)   Exam General appearance : Awake, alert, not in any distress. Speech Clear. Not toxic looking HEENT: Atraumatic and Normocephalic, pupils equally reactive to light and accomodation Neck: Supple, no JVD. No cervical lymphadenopathy.  Chest: Good air entry bilaterally, no added sounds  CVS: S1 S2 regular, no murmurs.  Abdomen: Bowel sounds present, Non  tender and not distended with no gaurding, rigidity or rebound. Extremities: B/L Lower Ext shows no edema, both legs are warm to touch Neurology: Awake alert, and oriented X 3, CN II-XII intact, Non focal Skin: No Rash  Data Review Lab Results  Component Value Date   HGBA1C 5.60 06/27/2015    Assessment & Plan   1. Chronic bilateral low back pain with sciatica, sciatica laterality unspecified  - Will try again. Sign a new pain contract, agrees to random UDS. Will add Gabapentin and Nortriptyline, stop trazodone. - traMADol (ULTRAM) 50  MG tablet; Take 1 tablet (50 mg total) by mouth every 6 (six) hours as needed.  Dispense: 120 tablet; Refill: 0 - gabapentin (NEURONTIN) 100 MG capsule; Take 1 capsule (100 mg total) by mouth 3 (three) times daily.  Dispense: 90 capsule; Refill: 3 - nortriptyline (PAMELOR) 25 MG capsule; Take 1 capsule (25 mg total) by mouth at bedtime.  Dispense: 30 capsule; Refill: 3  2. Depression with anxiety  - hydrOXYzine (ATARAX/VISTARIL) 25 MG tablet; Take 1-2 tablets (25-50 mg total) by mouth daily.  Dispense: 60 tablet; Refill: 3 - nortriptyline (PAMELOR) 25 MG capsule; Take 1 capsule (25 mg total) by mouth at bedtime.  Dispense: 30 capsule; Refill: 3  3. Essential hypertension: Uncontrolled partly because of intractable pain  - amLODipine (NORVASC) 10 MG tablet; Take 1 tablet (10 mg total) by mouth daily.  Dispense: 30 tablet; Refill: 5  Patient have been counseled extensively about nutrition and exercise. Other issues discussed during this visit include: low cholesterol diet, weight control and daily exercise, importance of adherence with medications and regular follow-up. We also discussed long term complications of uncontrolled hypertension.   Return in about 3 months (around 03/21/2017) for Follow up Pain and comorbidities.  The patient was given clear instructions to go to ER or return to medical center if symptoms don't improve, worsen or new problems develop. The patient verbalized understanding. The patient was told to call to get lab results if they haven't heard anything in the next week.   This note has been created with Education officer, environmentalDragon speech recognition software and smart phrase technology. Any transcriptional errors are unintentional.    Jeanann LewandowskyJEGEDE, Denys Salinger, MD, MHA, FACP, FAAP, CPE [facility] [place], [place] [phone]   12/19/2016, 2:32 PM

## 2016-12-19 NOTE — Progress Notes (Signed)
Patient has not eaten  Patient doesn't want to eat  Patient has not had medication

## 2016-12-31 MED FILL — NORTRIPTYLINE HCL 25 MG CAP: 25 | 30 days supply | Qty: 30 | Fill #0

## 2017-01-15 ENCOUNTER — Telehealth: Payer: Self-pay

## 2017-01-15 NOTE — Telephone Encounter (Signed)
Pt contacted the office and stated the new medicine that Dr. Hyman Hopes gave him for sleep is not helping he is requesting a new medicine. Also he is also requesting a refill of his tramadol. Also he states [place] and he is needing refills on his medications. Please send rx's to [facility]

## 2017-01-15 NOTE — Telephone Encounter (Signed)
Please authorize refills for chronic medications. Patient is requesting an alternative regarding the sleeping aide due to no relief being noted. Patient also request a refill of tramadol.

## 2017-01-30 ENCOUNTER — Telehealth: Payer: Self-pay | Admitting: Internal Medicine

## 2017-01-30 ENCOUNTER — Other Ambulatory Visit: Payer: Self-pay | Admitting: Internal Medicine

## 2017-01-30 DIAGNOSIS — M5440 Lumbago with sciatica, unspecified side: Principal | ICD-10-CM

## 2017-01-30 DIAGNOSIS — G8929 Other chronic pain: Secondary | ICD-10-CM

## 2017-01-30 MED ORDER — TRAMADOL HCL 50 MG PO TABS: 50.0000 mg | ORAL_TABLET | Freq: Four times a day (QID) | ORAL | 0 refills | Status: DC | PRN

## 2017-01-30 NOTE — Telephone Encounter (Signed)
Pt. Called requesting a refill on Tramadol. Pt. Will be leaving out of the [place] on  [place] 9[place]10. Please f/u

## 2017-01-30 NOTE — Telephone Encounter (Signed)
Tramadol Refilled 

## 2017-01-30 NOTE — Telephone Encounter (Signed)
Refilled

## 2017-03-20 ENCOUNTER — Ambulatory Visit: Payer: Medicaid Other | Admitting: Internal Medicine

## 2018-01-23 ENCOUNTER — Other Ambulatory Visit: Payer: Self-pay

## 2018-01-23 ENCOUNTER — Ambulatory Visit: Payer: Medicaid Other | Attending: Family Medicine | Admitting: Family Medicine

## 2018-01-23 ENCOUNTER — Encounter: Payer: Self-pay | Admitting: Family Medicine

## 2018-01-23 VITALS — BP 159/89 | HR 59 | Temp 98.6°F | Resp 18 | Ht 62.0 in | Wt 143.6 lb

## 2018-01-23 DIAGNOSIS — M5441 Lumbago with sciatica, right side: Secondary | ICD-10-CM

## 2018-01-23 DIAGNOSIS — R569 Unspecified convulsions: Secondary | ICD-10-CM

## 2018-01-23 DIAGNOSIS — I10 Essential (primary) hypertension: Secondary | ICD-10-CM | POA: Diagnosis not present

## 2018-01-23 DIAGNOSIS — F17210 Nicotine dependence, cigarettes, uncomplicated: Secondary | ICD-10-CM | POA: Diagnosis not present

## 2018-01-23 DIAGNOSIS — M5440 Lumbago with sciatica, unspecified side: Secondary | ICD-10-CM | POA: Diagnosis not present

## 2018-01-23 DIAGNOSIS — F418 Other specified anxiety disorders: Secondary | ICD-10-CM | POA: Diagnosis not present

## 2018-01-23 DIAGNOSIS — Z2821 Immunization not carried out because of patient refusal: Secondary | ICD-10-CM | POA: Insufficient documentation

## 2018-01-23 DIAGNOSIS — G8929 Other chronic pain: Secondary | ICD-10-CM | POA: Diagnosis not present

## 2018-01-23 DIAGNOSIS — Z09 Encounter for follow-up examination after completed treatment for conditions other than malignant neoplasm: Secondary | ICD-10-CM | POA: Diagnosis present

## 2018-01-23 DIAGNOSIS — Z8249 Family history of ischemic heart disease and other diseases of the circulatory system: Secondary | ICD-10-CM | POA: Diagnosis not present

## 2018-01-23 DIAGNOSIS — M5442 Lumbago with sciatica, left side: Secondary | ICD-10-CM | POA: Insufficient documentation

## 2018-01-23 DIAGNOSIS — Z79899 Other long term (current) drug therapy: Secondary | ICD-10-CM | POA: Insufficient documentation

## 2018-01-23 DIAGNOSIS — G4700 Insomnia, unspecified: Secondary | ICD-10-CM | POA: Diagnosis not present

## 2018-01-23 MED ORDER — PROPRANOLOL HCL 10 MG PO TABS: 10.0000 mg | ORAL_TABLET | Freq: Three times a day (TID) | ORAL | 6 refills | Status: DC

## 2018-01-23 MED ORDER — SERTRALINE HCL 50 MG PO TABS: 50.0000 mg | ORAL_TABLET | Freq: Every day | ORAL | 6 refills | Status: DC

## 2018-01-23 MED ORDER — CLONAZEPAM 1 MG PO TABS: ORAL_TABLET | ORAL | 1 refills | Status: DC

## 2018-01-23 MED ORDER — TRAMADOL HCL 50 MG PO TABS: 50.0000 mg | ORAL_TABLET | Freq: Two times a day (BID) | ORAL | 1 refills | Status: DC | PRN

## 2018-01-23 MED ORDER — DIVALPROEX SODIUM 500 MG PO DR TAB: 500.0000 mg | DELAYED_RELEASE_TABLET | Freq: Three times a day (TID) | ORAL | 6 refills | Status: AC

## 2018-01-23 NOTE — Progress Notes (Signed)
Patient declined flu shot today.

## 2018-01-23 NOTE — Progress Notes (Signed)
Subjective:    Patient ID: Daniel Adams, male    DOB: [birth year], 59 y.o.   MRN: [id number]  HPI       57 year old male who was seen in follow-up of issues with chronic back pain, hypertension, depression with anxiety, chronic insomnia and patient reports hospital evaluation a few months ago for seizure while he was visiting his mother in [place].  Patient states that he had run out of his medications and had an episode that he believes was a seizure and he was taken to the hospital.  Patient states that he thinks that they put him on the right medications and he has had no further seizures.  Patient thinks that the seizure occurred about 5 months ago.  Patient is currently taking clonazepam 2 mg at bedtime which patient states helps him sleep.  Patient is on propranolol for his blood pressure but he thinks he is about to run out of this medication.  Patient states he is on Depakote due to his seizures and patient is also on Zoloft to help with his anxiety and depression.  Patient states that he feels well at today's visit other than chronic low back pain that since sharp pain down either buttock and sometimes the pain goes down past his knees.  Patient states that he would like tramadol for pain as he has taken this medication in the past and it was helpful.  Patient states that Neurontin did not really seem to help with his low back pain.  Patient reports that he has been sleeping well with the use of clonazepam 2 mg at bedtime.  Patient denies any suicidal thoughts or ideations.  Patient is accompanied by his daughter at today's visit.  Patient states that he does continue to smoke about 1/4 pack of cigarettes per day.  Patient is not currently working.  Patient reports his family history is significant for hypertension.  Patient states that his mother has hypertension and is on medication but otherwise there is no diabetes, heart attack/heart disease, cancers or strokes in his family history.         Review of Systems  Constitutional: Positive for fatigue. Negative for chills and fever.  HENT: Negative for dental problem and sore throat.   Respiratory: Negative for cough and shortness of breath.   Cardiovascular: Negative for chest pain, palpitations and leg swelling.  Gastrointestinal: Negative for abdominal pain, blood in stool and nausea.  Genitourinary: Negative for dysuria and frequency.  Neurological: Negative for dizziness and headaches.  Psychiatric/Behavioral: Negative for sleep disturbance (Patient reports clonazepam is helping him sleep). The patient is nervous/anxious (Patient states that anxiety is minimal with his current medications).       Objective:   Physical Exam BP (!) 159/89 (BP Location: Right Arm, Patient Position: Sitting, Cuff Size: Normal)   Pulse (!) 59   Temp 98.6 F (37 C) (Oral)   Resp 18   Ht 5\' 2"  (1.575 m)   Wt 143 lb 9.6 oz (65.1 kg)   SpO2 97%   BMI 26.26 kg/m  Vital signs reviewed General- well-nourished, well-developed older male in no acute distress but patient does appear to be slightly sleepy but patient interacts appropriately throughout the visit.  Patient speech is somewhat slow but as this is the first time that I am seeing the patient, I am not sure if this is his regular speech pattern.  Patient is accompanied by his adult daughter at today's visit. ENT- TMs gray, nares with mild edema  of the nasal turbinates, mild posterior pharynx erythema. Neck-supple, no lymphadenopathy, no thyromegaly, no carotid bruit. Lungs-clear to auscultation bilaterally Cardiovascular-regular rate and rhythm Abdomen-soft and nontender Back- patient with some lumbosacral discomfort to palpation and throughout the lumbar paraspinous spasm Extremities-no edema Psychologic- patient appears to exhibit normal judgment but has a slightly flattened affect and appears slightly sleepy and patient with a slow speech pattern but patient does answer questions and  interact appropriately   Assessment & Plan:  1. Essential hypertension Patient reports that he is currently on propranolol 10 mg 3 times daily for control of blood pressure.  Patient does have a box that has propranolol in [place] but instructions are not in English therefore medication is being prescribed as per daughter's and patient's translation of instructions.  Patient will return to clinic in approximately 4 weeks for recheck of blood pressure but she return to clinic sooner if he is having headaches, dizziness or any concerns - propranolol (INDERAL) 10 MG tablet; Take 1 tablet (10 mg total) by mouth 3 (three) times daily.  Dispense: 90 tablet; Refill: 6  2. Chronic bilateral low back pain with sciatica, sciatica laterality unspecified Patient with complaint of chronic bilateral low back pain with sciatica.  Patient was offered Neurontin/gabapentin but patient states that this medication did not work as well as tramadol and he would like to have tramadol field.  Patient was provided with a prescription for tramadol and 1 refill.  Patient however does have a history of substance abuse and will obtain drug screen at next visit.  Will make decision regarding continuation of pain medication based on his next visit and urine drug screen results. - traMADol (ULTRAM) 50 MG tablet; Take 1 tablet (50 mg total) by mouth every 12 (twelve) hours as needed for severe pain.  Dispense: 60 tablet; Refill: 1  3. Depression with anxiety Patient with anxiety and depression and patient is currently on Zoloft but is also on clonazepam currently at 2 mg at bedtime.  I discussed with the patient and his daughter that this medication can cause dependence and addiction and I would like to wean the patient off of this medication.  Patient was not agreeable with the decrease to 1 mg at this time therefore compromise was made that clonazepam will be reduced to 1-1/2 tablets at bedtime for the next 4 weeks and at his next  visit, medication will be decreased to 1 mg.  Patient expresses that he is taking the medication more so to help with his sleep.  Sleep hygiene discussed as well as addition of melatonin when his dose is further decreased. - clonazePAM (KLONOPIN) 1 MG tablet; Take 1 and 1/2 tablets at bedtime  Dispense: 45 tablet; Refill: 1 - sertraline (ZOLOFT) 50 MG tablet; Take 1 tablet (50 mg total) by mouth daily.  Dispense: 30 tablet; Refill: 6  4. Seizure [facility](HCC) Patient reports that he had a seizure about 5 months ago while in [place] visiting his mother.  Patient states that he was then placed on Depakote.  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  I am unsure of the etiology of patient's seizures while in [place] but will continue his Depakote at this time.  Patient will have a Depakote level at his next visit.  Patient will also likely be referred to neurology for further evaluation of his seizures which he returns for 4-week follow-up. - divalproex (DEPAKOTE) 500  MG DR tablet; Take 1 tablet (500 mg total) by mouth 3 (three) times daily.  Dispense: 90 tablet; Refill: 6  -Patient was offered but declined influenza immunization at today's visit  An After Visit Summary was printed and given to the patient.  Allergies as of 01/23/2018      Reactions   Wellbutrin [bupropion Hcl]       Medication List        Accurate as of 01/23/18 10:11 PM. Always use your most recent med list.          clonazePAM 1 MG tablet Commonly known as:  KLONOPIN Take 1 and 1/2 tablets at bedtime   divalproex 500 MG DR tablet Commonly known as:  DEPAKOTE Take 1 tablet (500 mg total) by mouth 3 (three) times daily.   neomycin-polymyxin-hydrocortisone OTIC solution Commonly known as:  CORTISPORIN Place 4 drops into the left ear 4 (four) times daily.   propranolol 10 MG tablet Commonly known as:  INDERAL Take 1 tablet (10 mg total) by mouth 3  (three) times daily.   sertraline 50 MG tablet Commonly known as:  ZOLOFT Take 1 tablet (50 mg total) by mouth daily.   traMADol 50 MG tablet Commonly known as:  ULTRAM Take 1 tablet (50 mg total) by mouth every 12 (twelve) hours as needed for severe pain.      Return in about 4 weeks (around 02/20/2018).

## 2018-01-30 ENCOUNTER — Telehealth: Payer: Self-pay | Admitting: Family Medicine

## 2018-01-30 NOTE — Telephone Encounter (Signed)
Patient would like to know if he should continue taking amplodipine because this last OV he was not prescribed any, but he still has two refills. Please follow up with patient.

## 2018-02-07 NOTE — Telephone Encounter (Signed)
Please confirm what patient is currently taking for control of his blood pressure.  Patient at his recent visit had a box labeled propranolol that he had received from another physician when [place] was [place].  Patient was asked to return and have his blood pressure checked by clinical pharmacist.  Please see if patient has [facility].  If his blood pressure is not controlled on the propranolol when he comes in for blood pressure recheck, then he will likely be told to restart the amlodipine as well.  If he is not currently taking any medication for his blood pressure than he needs to restart the amlodipine but still come in in about 4 weeks for blood pressure recheck.  He of course should come in for blood pressure check with [facility] in the next 1 to 2 weeks if he is on the propranolol or if he is having any headaches or dizziness which he thinks are related to elevated blood pressure.

## 2018-02-14 NOTE — Telephone Encounter (Signed)
It does not look as if this done/answered as to which medication the patient is taking currently for his headaches. Will see if someone else can attempt to contact the patient

## 2018-02-18 NOTE — Telephone Encounter (Signed)
Tried contacting pt, pt didn't answer

## 2018-02-21 NOTE — Telephone Encounter (Signed)
Please attempt to contact patient again and if you are unable to contact the patient then please close this encounter

## 2018-02-24 ENCOUNTER — Ambulatory Visit: Payer: Medicaid Other | Attending: Family Medicine | Admitting: Family Medicine

## 2018-02-24 ENCOUNTER — Other Ambulatory Visit: Payer: Self-pay

## 2018-02-24 ENCOUNTER — Encounter: Payer: Self-pay | Admitting: Family Medicine

## 2018-02-24 VITALS — BP 149/97 | HR 63 | Temp 98.7°F | Resp 18 | Ht 62.0 in | Wt 140.8 lb

## 2018-02-24 DIAGNOSIS — I10 Essential (primary) hypertension: Secondary | ICD-10-CM

## 2018-02-24 MED ORDER — AMLODIPINE BESYLATE 5 MG PO TABS: 5.0000 mg | ORAL_TABLET | Freq: Every day | ORAL | 3 refills | Status: AC

## 2018-02-24 NOTE — Patient Instructions (Addendum)
Contact our new location, Primary Care at [facility] on Monday, October 7th to schedule your two week follow.  Phone: [phone]      Hypertension Hypertension is another name for high blood pressure. High blood pressure forces your heart to work harder to pump blood. This can cause problems over time. There are two numbers in a blood pressure reading. There is a top number (systolic) over a bottom number (diastolic). It is best to have a blood pressure below 120/80. Healthy choices can help lower your blood pressure. You may need medicine to help lower your blood pressure if:  Your blood pressure cannot be lowered with healthy choices.  Your blood pressure is higher than 130/80.  Follow these instructions at home: Eating and drinking  If directed, follow the DASH eating plan. This diet includes: ? Filling half of your plate at each meal with fruits and vegetables. ? Filling one quarter of your plate at each meal with whole grains. Whole grains include whole wheat pasta, brown rice, and whole grain bread. ? Eating or drinking low-fat dairy products, such as skim milk or low-fat yogurt. ? Filling one quarter of your plate at each meal with low-fat (lean) proteins. Low-fat proteins include fish, skinless chicken, eggs, beans, and tofu. ? Avoiding fatty meat, cured and processed meat, or chicken with skin. ? Avoiding premade or processed food.  Eat less than 1,500 mg of salt (sodium) a day.  Limit alcohol use to no more than 1 drink a day for nonpregnant women and 2 drinks a day for men. One drink equals 12 oz of beer, 5 oz of wine, or 1 oz of hard liquor. Lifestyle  Work with your doctor to stay at a healthy weight or to lose weight. Ask your doctor what the best weight is for you.  Get at least 30 minutes of exercise that causes your heart to beat faster (aerobic exercise) most days of the week. This may include walking, swimming, or biking.  Get at least 30 minutes of  exercise that strengthens your muscles (resistance exercise) at least 3 days a week. This may include lifting weights or pilates.  Do not use any products that contain nicotine or tobacco. This includes cigarettes and e-cigarettes. If you need help quitting, ask your doctor.  Check your blood pressure at home as told by your doctor.  Keep all follow-up visits as told by your doctor. This is important. Medicines  Take over-the-counter and prescription medicines only as told by your doctor. Follow directions carefully.  Do not skip doses of blood pressure medicine. The medicine does not work as well if you skip doses. Skipping doses also puts you at risk for problems.  Ask your doctor about side effects or reactions to medicines that you should watch for. Contact a doctor if:  You think you are having a reaction to the medicine you are taking.  You have headaches that keep coming back (recurring).  You feel dizzy.  You have swelling in your ankles.  You have trouble with your vision. Get help right away if:  You get a very bad headache.  You start to feel confused.  You feel weak or numb.  You feel faint.  You get very bad pain in your: ? Chest. ? Belly (abdomen).  You throw up (vomit) more than once.  You have trouble breathing. Summary  Hypertension is another name for high blood pressure.  Making healthy choices can help lower blood pressure. If your blood pressure  cannot be controlled with healthy choices, you may need to take medicine. This information is not intended to replace advice given to you by your health care provider. Make sure you discuss any questions you have with your health care provider. Document Released: 10/31/2007 Document Revised: 04/11/2016 Document Reviewed: 04/11/2016 Elsevier Interactive Patient Education  [employer].

## 2018-02-24 NOTE — Progress Notes (Addendum)
Daniel Adams, is a 57 y.o. male  [id number]  [id number]  DOB - 10/20/1960  CC:  Chief Complaint  Patient presents with  . Follow-up       HPI: Daniel Adams is a 57 y.o. male is here today for hypertension follow-up. He was taken off of blood pressure medication in the past, however uncertain of reason. He continues to smoke daily, smokes less than <1/2 pack per day. No home monitoring of blood pressure. BP was elevated during last office visit.  Efforts to adhere to low sodium. difficulty getting around due to chronic neck and back pain. Denies shortness of breath, dizziness, chest pain, or headaches. Suffers from chronic headaches which are well managed with propranolol.   Chronic health problems include:has Essential hypertension; Hyperlipidemia; Subacromial or subdeltoid bursitis; Insomnia; Depression with anxiety; Convulsions (HCC); Seizure (HCC); Right shoulder pain; Vitamin D deficiency; Chronic cervical pain; and Chronic bilateral low back pain with sciatica on their problem list.   Current medications: Current Outpatient Medications:  .  clonazePAM (KLONOPIN) 1 MG tablet, Take 1 and 1/2 tablets at bedtime, Disp: 45 tablet, Rfl: 1 .  divalproex (DEPAKOTE) 500 MG DR tablet, Take 1 tablet (500 mg total) by mouth 3 (three) times daily., Disp: 90 tablet, Rfl: 6 .  sertraline (ZOLOFT) 50 MG tablet, Take 1 tablet (50 mg total) by mouth daily., Disp: 30 tablet, Rfl: 6 .  traMADol (ULTRAM) 50 MG tablet, Take 1 tablet (50 mg total) by mouth every 12 (twelve) hours as needed for severe pain., Disp: 60 tablet, Rfl: 1 .  neomycin-polymyxin-hydrocortisone (CORTISPORIN) otic solution, Place 4 drops into the left ear 4 (four) times daily. (Patient not taking: Reported on 05/08/2016), Disp: 10 mL, Rfl: 0 .  propranolol (INDERAL) 10 MG tablet, Take 1 tablet (10 mg total) by mouth 3 (three) times daily. (Patient not taking: Reported on 02/24/2018), Disp: 90 tablet, Rfl: 6   Pertinent family medical  history: family history includes Colon cancer in his paternal aunt. Allergies  Allergen Reactions  . Wellbutrin [Bupropion Hcl] Other (See Comments)    seizures    Social History   Socioeconomic History  . Marital status: Married    Spouse name: Not on file  . Number of children: 1  . Years of education: Not on file  . Highest education level: Not on file  Occupational History  . Occupation: Unemployed  Social Needs  . Financial resource strain: Not on file  . Food insecurity:    Worry: Not on file    Inability: Not on file  . Transportation needs:    Medical: Not on file    Non-medical: Not on file  Tobacco Use  . Smoking status: Current Every Day Smoker    Types: Cigarettes  . Smokeless tobacco: Never Used  . Tobacco comment: Smoking 3-4 cigs/ day  Substance and Sexual Activity  . Alcohol use: No    Alcohol/week: 0.0 standard drinks    Comment: Former  . Drug use: No  . Sexual activity: Not on file  Lifestyle  . Physical activity:    Days per week: Not on file    Minutes per session: Not on file  . Stress: Not on file  Relationships  . Social connections:    Talks on phone: Not on file    Gets together: Not on file    Attends religious service: Not on file    Active member of club or organization: Not on file    Attends meetings of clubs or  organizations: Not on file    Relationship status: Not on file  . Intimate partner violence:    Fear of current or ex partner: Not on file    Emotionally abused: Not on file    Physically abused: Not on file    Forced sexual activity: Not on file  Other Topics Concern  . Not on file  Social History Narrative   From [place]     Respiratory: Negative for cough, choking, chest tightness, shortness of breath, wheezing and stridor.  Cardiovascular: Negative for chest pain, palpitations and leg swelling. Psychiatric/Behavioral: Negative for hallucinations, behavioral problems, confusion, dysphoric mood, decreased  concentration and agitation.    Objective:   Vitals:   02/24/18 1549  BP: (!) 149/97  Pulse: 63  Resp: 18  Temp: 98.7 F (37.1 C)  SpO2: 97%    Physical Exam: Constitutional: Patient appears well-developed and well-nourished. No distress. HENT: Normocephalic, atraumatic, External right and left ear normal. Oropharynx is clear and moist.  Eyes: Conjunctivae and EOM are normal. PERRLA, no scleral icterus. Neck: Normal ROM. Neck supple. No JVD.  CVS: RRR, S1/S2 +, no murmurs, no gallops, no carotid bruit.  Pulmonary: Effort and breath sounds normal, no stridor, rhonchi, wheezes, rales.  Skin: Skin is warm and dry. No rash noted. Not diaphoretic. No erythema. No pallor. Psychiatric: Normal mood and affect. Behavior, judgment, thought content normal.  Lab Results  Component Value Date   WBC 9.9 05/19/2015   HGB 15.5 05/19/2015   HCT 46.5 05/19/2015   MCV 86.0 05/19/2015   PLT 389 05/19/2015   Lab Results  Component Value Date   CREATININE 0.98 05/19/2015   BUN 8 05/19/2015   NA 140 05/19/2015   K 5.3 05/19/2015   CL 102 05/19/2015   CO2 30 05/19/2015    Lab Results  Component Value Date   HGBA1C 5.60 06/27/2015    Lipid Panel     Component Value Date/Time   CHOL 237 (H) 05/19/2015 0902   TRIG 122 05/19/2015 0902   HDL 52 05/19/2015 0902   CHOLHDL 4.6 05/19/2015 0902   VLDL 24 05/19/2015 0902   LDLCALC 161 (H) 05/19/2015 0902        Assessment and plan:  1. Essential hypertension, uncontrolled We have discussed target BP range and blood pressure goal. I have advised patient to check BP regularly and to call us back or report to clinic if the numbers are consistently higher than 140/90. We discussed the importance of compliance with medical therapy and DASH diet recommended, consequences of uncontrolled hypertension discussed.   Meds ordered this encounter  Medications  . amLODipine (NORVASC) 5 MG tablet    Sig: Take 1 tablet (5 mg total) by mouth daily.     Dispense:  90 tablet    Refill:  3   Will titrate dose of Amlodipine if BP remains not at goal.  Complete [facility] transportation assistance form.  Return in about 2 weeks (around 03/10/2018), or if symptoms worsen or fail to improve, for f/u on Blood pressure, fasting labs, ,address chronic pain. .  The patient was given clear instructions to go to ER or return to medical center if symptoms don't improve, worsen or new problems develop. The patient verbalized understanding. The patient was told to call to get lab results if they haven't heard anything in the next week.    Godfrey Pick. Tiburcio Pea, MSN, [facility]  [address] [place], [place], [place] [address] [phone]  This note has been created with Surveyor, quantity. Any transcriptional errors are unintentional.

## 2018-02-24 NOTE — Telephone Encounter (Signed)
Tried contacting pt for a second time no answer

## 2018-02-24 NOTE — Progress Notes (Signed)
Medications sent to [facility]

## 2018-03-17 ENCOUNTER — Ambulatory Visit (INDEPENDENT_AMBULATORY_CARE_PROVIDER_SITE_OTHER): Payer: Medicaid Other | Admitting: Family Medicine

## 2018-03-17 ENCOUNTER — Encounter: Payer: Self-pay | Admitting: Family Medicine

## 2018-03-17 ENCOUNTER — Telehealth: Payer: Self-pay | Admitting: Family Medicine

## 2018-03-17 VITALS — BP 141/72 | HR 58 | Temp 97.6°F | Resp 17 | Ht 62.0 in | Wt 139.8 lb

## 2018-03-17 DIAGNOSIS — F1190 Opioid use, unspecified, uncomplicated: Secondary | ICD-10-CM

## 2018-03-17 DIAGNOSIS — Z125 Encounter for screening for malignant neoplasm of prostate: Secondary | ICD-10-CM

## 2018-03-17 DIAGNOSIS — Z130 Encounter for screening for diseases of the blood and blood-forming organs and certain disorders involving the immune mechanism: Secondary | ICD-10-CM | POA: Diagnosis not present

## 2018-03-17 DIAGNOSIS — G40909 Epilepsy, unspecified, not intractable, without status epilepticus: Secondary | ICD-10-CM

## 2018-03-17 DIAGNOSIS — M5440 Lumbago with sciatica, unspecified side: Secondary | ICD-10-CM

## 2018-03-17 DIAGNOSIS — I10 Essential (primary) hypertension: Secondary | ICD-10-CM

## 2018-03-17 DIAGNOSIS — Z79899 Other long term (current) drug therapy: Secondary | ICD-10-CM

## 2018-03-17 DIAGNOSIS — G8929 Other chronic pain: Secondary | ICD-10-CM

## 2018-03-17 DIAGNOSIS — Z131 Encounter for screening for diabetes mellitus: Secondary | ICD-10-CM

## 2018-03-17 MED ORDER — POLYETHYLENE GLYCOL 3350 17 GM/SCOOP PO POWD: 17.0000 g | Freq: Two times a day (BID) | ORAL | 1 refills | Status: AC | PRN

## 2018-03-17 MED ORDER — TRAMADOL HCL 50 MG PO TABS: 50.0000 mg | ORAL_TABLET | Freq: Two times a day (BID) | ORAL | 0 refills | Status: DC | PRN

## 2018-03-17 MED ORDER — MELOXICAM 15 MG PO TABS: 15.0000 mg | ORAL_TABLET | Freq: Every day | ORAL | 0 refills | Status: DC

## 2018-03-17 NOTE — Patient Instructions (Addendum)
Have labs drawn at [facility]  [address] [place], [place] [address] [phone]    Thank you for choosing Primary Care at [facility] for your medical home!    Daniel Adams was seen by Joaquin Courts, FNP today.   Darreld Mclean primary care provider is Bing Neighbors, FNP.   For the best care possible,  you should try to see whenever you come to clinic.   We look forward to seeing you again soon!  If you have any questions about your visit today,  please call us at   Or feel free to reach your provider via MyChart.     Chronic Pain, Adult Chronic pain is a type of pain that lasts or keeps coming back (recurs) for at least six months. You may have chronic headaches, abdominal pain, or body pain. Chronic pain may be related to an illness, such as fibromyalgia or complex regional pain syndrome. Sometimes the cause of chronic pain is not known. Chronic pain can make it hard for you to do daily activities. If not treated, chronic pain can lead to other health problems, including anxiety and depression. Treatment depends on the cause and severity of your pain. You may need to work with a pain specialist to come up with a treatment plan. The plan may include medicine, counseling, and physical therapy. Many people benefit from a combination of two or more types of treatment to control their pain. Follow these instructions at home: Lifestyle  Consider keeping a pain diary to share with your health care providers.  Consider talking with a mental health care provider (psychologist) about how to cope with chronic pain.  Consider joining a chronic pain support group.  Try to control or lower your stress levels. Talk to your health care provider about strategies to do this. General instructions   Take over-the-counter and prescription medicines only as told by your health care provider.  Follow your treatment plan as told by  your health care provider. This may include: ? Gentle, regular exercise. ? Eating a healthy diet that includes foods such as vegetables, fruits, fish, and lean meats. ? Cognitive or behavioral therapy. ? Working with a Adult nurse. ? Meditation or yoga. ? Acupuncture or massage therapy. ? Aroma, color, light, or sound therapy. ? Local electrical stimulation. ? Shots (injections) of numbing or pain-relieving medicines into the spine or the area of pain.  Check your pain level as told by your health care provider. Ask your health care provider if you should use a pain scale.  Learn as much as you can about how to manage your chronic pain. Ask your health care provider if an intensive pain rehabilitation program or a chronic pain specialist would be helpful.  Keep all follow-up visits as told by your health care provider. This is important. Contact a health care provider if:  Your pain gets worse.  You have new pain.  You have trouble sleeping.  You have trouble doing your normal activities.  Your pain is not controlled with treatment.  Your have side effects from pain medicine.  You feel weak. Get help right away if:  You lose feeling or have numbness in your body.  You lose control of bowel or bladder function.  Your pain suddenly gets much worse.  You develop shaking or chills.  You develop confusion.  You develop chest pain.  You have trouble breathing or shortness of breath.  You pass out.  You have thoughts about hurting yourself or others. This information is not intended to replace advice given to you by your health care provider. Make sure you discuss any questions you have with your health care provider. Document Released: 02/03/2002 Document Revised: 01/12/2016 Document Reviewed: 11/01/2015 Elsevier Interactive Patient Education  [employer].

## 2018-03-17 NOTE — Telephone Encounter (Signed)
Patient presented today to establish care accompanied by his wife and requested a prescription for tramadol.  Prior to leaving and during our office visit I e does contract xplained to him that he would have to sign a new controlled substance contract in order to continue refilling his tramadol and also I would need to obtain a urine drug screen today.  He refused urine drug screen and refused to sign the controlled substance contract.  Medication had previously been refilled electronically however I contacted the pharmacy and voided the tramadol will refill given that patient was noncompliant with procedure of signing contract and consenting to random drug screen.  Joaquin Courts, FNP Primary Care at [facility] [address] [place] [address] [phone]: [phone]

## 2018-03-17 NOTE — Telephone Encounter (Signed)
Patient's wife called in to see why patient's prescriptions were cancelled at the pharmacy. Informed wife that it is up to provider's discretion to prescribe medications. Informed her that Rxs were cancelled due to patient refusing drug screen & refusing to sign the controlled substance contract.

## 2018-03-17 NOTE — Progress Notes (Signed)
Daniel Adams, is a 57 y.o. male  ZOX:[id number]  [id number]  DOB - [birth year]  CC:  Chief Complaint  Patient presents with  . Establish Care  . Medication Refill    would like a rf on Tramadol       HPI: Daniel Adams is a 58 y.o. male is here today to establish care.   Daniel Adams has Essential hypertension; Hyperlipidemia; Insomnia; Depression with anxiety; Convulsions (HCC); Seizure (HCC); Vitamin D deficiency; Chronic cervical pain; and Chronic bilateral low back pain with sciatica on their problem list.    Today's visit:  Daniel Adams presents today to establish care. Recently followed at [facility].  Daniel Adams is here today requesting a refill on his chronic tramadol prescription in for evaluation of hypertension.  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 Patient is requesting an increase dosage or frequency for the tramadol. Tramadol is currently prescribed for severe pain twice daily. Daniel Adams is also currently taking Clonazepam daily for sleep. Daniel Adams suffers from chronic back pain. Has been in pain management previously and was prescribed oxycodone for pain. His wife who present during visit remarks, that Daniel Adams often mixed oxycodone and benzodiazepines together which ultimately Daniel Adams was released from pain management. Daniel Adams requests to be referred back to pain management. Daniel Adams complains of 6-9/10 daily mostly localized to his back. Daniel Adams uses a cane for mobility. Daniel Adams is from  [place] and Daniel Adams and wife reports that Daniel Adams obtains medications while there that Daniel Adams is unable to obtain here. Daniel Adams has a history if seizure disorder, prescribed  Depakote which was initiated in [place], per wife. Patient has a seizure disorder without routine follow-up with neurology. Uncertain of last seizure. Not a great medical historian and spouse if constantly interrupting during the visit.   Patient denies new headaches,  chest pain, abdominal pain, nausea, new weakness , numbness or tingling, SOB, edema, or worrisome cough.   Current medications: Current Outpatient Medications:  .  amLODipine (NORVASC) 5 MG tablet, Take 1 tablet (5 mg total) by mouth daily., Disp: 90 tablet, Rfl: 3 .  clonazePAM (KLONOPIN) 1 MG tablet, Take 1 and 1/2 tablets at bedtime, Disp: 45 tablet, Rfl: 1 .  divalproex (DEPAKOTE) 500 MG DR tablet, Take 1 tablet (500 mg total) by mouth 3 (three) times daily., Disp: 90 tablet, Rfl: 6 .  sertraline (ZOLOFT) 50 MG tablet, Take 1 tablet (50 mg total) by mouth daily., Disp: 30 tablet, Rfl: 6 .  traMADol (ULTRAM) 50 MG tablet, Take 1 tablet (50 mg total) by mouth every 12 (twelve) hours as needed for severe pain., Disp: 60 tablet, Rfl: 1   Pertinent family medical history: family history includes Colon cancer in his paternal aunt.   Allergies  Allergen Reactions  . Wellbutrin [Bupropion Hcl] Other (See Comments)    seizures    Social History   Socioeconomic History  . Marital status: Married    Spouse name: Not on file  . Number of children: 1  . Years of education: Not on file  . Highest education level: Not on file  Occupational History  . Occupation: Unemployed  Social Needs  . Financial resource strain: Not on file  . Food insecurity:    Worry: Not on file    Inability: Not on file  . Transportation needs:    Medical: Not on file    Non-medical: Not on file  Tobacco Use  . Smoking status: Current Every Day Smoker    Types: Cigarettes  . Smokeless tobacco: Never Used  . Tobacco comment: Smoking 3-4 cigs/ day  Substance and Sexual Activity  . Alcohol use: No    Alcohol/week: 0.0 standard drinks    Comment: Former  . Drug use: No  . Sexual activity: Not on file  Lifestyle  . Physical activity:    Days per week: Not on file    Minutes per session: Not on file  . Stress: Not on file  Relationships  . Social connections:    Talks on phone: Not on file    Gets  together: Not on file    Attends religious service: Not on file    Active member of club or organization: Not on file    Attends meetings of clubs or organizations: Not on file    Relationship status: Not on file  . Intimate partner violence:    Fear of current or ex partner: Not on file    Emotionally abused: Not on file    Physically abused: Not on file    Forced sexual activity: Not on file  Other Topics Concern  . Not on file  Social History Narrative   From [place]   Review of Systems  Respiratory: Negative for cough, shortness of breath and wheezing.   Cardiovascular: Negative for chest pain and palpitations.  Gastrointestinal: Positive for constipation.  Genitourinary: Negative.   Musculoskeletal: Positive for back pain and myalgias. Negative for falls.  Neurological: Positive for weakness. Negative for tingling.  Psychiatric/Behavioral: The patient is nervous/anxious and has insomnia.    Objective:   Vitals:   03/17/18 1337  BP: (!) 141/72  Pulse: (!) 58  Resp: 17  Temp: 97.6 F (36.4 C)  SpO2: 97%    BP Readings from Last 3 Encounters:  03/17/18 (!) 141/72  02/24/18 (!) 149/97  01/23/18 (!) 159/89    Filed Weights   03/17/18 1337  Weight: 139 lb 12.8 oz (63.4 kg)      Physical Exam  Constitutional:  Thin appearing and frail  Eyes: Pupils are equal, round, and reactive to light. Conjunctivae and EOM are normal.  Neck: Normal range of motion. No thyromegaly present.  Cardiovascular: Normal rate, regular rhythm, normal heart sounds and intact distal pulses.  Pulmonary/Chest: Effort normal and breath sounds normal.  Neurological: Daniel Adams is alert. Coordination normal.  Antalgic gait.   Skin: Skin is warm and dry.  Psychiatric: His mood appears anxious.   Lab Results  Component Value Date   WBC 9.9 05/19/2015   HGB 15.5 05/19/2015   HCT 46.5 05/19/2015   MCV 86.0 05/19/2015   PLT 389 05/19/2015   Lab Results  Component Value Date   CREATININE 0.98  05/19/2015   BUN 8 05/19/2015   NA 140 05/19/2015   K 5.3 05/19/2015   CL 102 05/19/2015   CO2 30 05/19/2015    Lab Results  Component Value Date   HGBA1C 5.60 06/27/2015       Component Value Date/Time   CHOL 237 (H) 05/19/2015 0902   TRIG 122 05/19/2015 0902   HDL 52 05/19/2015 0902   CHOLHDL 4.6 05/19/2015 0902   VLDL 24 05/19/2015 0902   LDLCALC 161 (H) 05/19/2015 0902        Assessment and plan:  1. Chronic bilateral low back pain with sciatica, sciatica laterality unspecified, see CT imaging 12/14/2014 - Ambulatory referral to Neurology - Ambulatory referral to Pain Clinic  2. Essential hypertension -Comprehensive metabolic panel; Future - Thyroid Panel With TSH; Future - Lipid panel; Future  3. Chronic, continuous use of opioids - [id number] 11+Oxyco+Alc+Crt-Bund  4. Screening for deficiency anemia - CBC with Differential; Future  5. Screening for diabetes mellitus - Hemoglobin A1c; Future  6. Screening PSA (prostate specific antigen) - PSA; Future  7. High risk medications (not anticoagulants) long-term use Patient has a documented history of substance abuse and abuse of controlled medications.  We had a lengthy discussion during today's visit regarding the mixture of benzodiazepines and tramadol.  Advised patient on any subsequent refills of clonazepam we would work to taper him down the goals of getting him completely off of benzodiazepines.  I advised that I will reduce current dose of clonazepam 1 mg to 0.5 mg once daily after Daniel Adams refills his current prescription and work to transition to another medication to facilitate sleep.  Patient was advised initially by me that Daniel Adams will have to sign a new controlled medication contract as Daniel Adams has established here this practice they as a new patient of record for me.  Daniel Adams was also advised that Daniel Adams would have to submit a urine drug screen.  Daniel Adams refused a urine drug screen and refused to sign the medication contract.  These  refusals took place while I was in another room with the patient.  Daniel Adams actually drew complete line through the contract and refused to sign it.  Tramadol had initially already been sent electronically to his pharmacy on record however I contacted the pharmacy and discontinued the prescription given that patient was not compliant with our policy chronic controlled medication prescribing. I will refer him to pain management although I did express to him during his visit there is no guarantee that pain management will except him as a patient given his past history of being discharged from a pain management facility.  8. Seizure disorder, unknown etiology Currently prescribed Depakote patient could not verify date of last seizure activity.   Daniel Adams does not operate motor vehicles. Referring patient to neurology for further evaluation as to if patient requires chronic antiepileptic medication.    Orders Placed This Encounter  Procedures  . [id number] 11+Oxyco+Alc+Crt-Bund  . CBC with Differential    Standing Status:   Future    Standing Expiration Date:   03/18/2019  . Hemoglobin A1c    Standing Status:   Future    Standing Expiration Date:   03/18/2019  . Comprehensive metabolic panel    Standing Status:   Future    Standing Expiration Date:   03/18/2019    Order Specific Question:   Has the patient fasted?    Answer:   No  . Thyroid Panel With TSH    Standing Status:   Future    Standing Expiration Date:   03/18/2019  . PSA    Standing Status:   Future    Standing Expiration Date:   03/18/2019  . Lipid panel    Standing Status:   Future    Standing Expiration Date:   03/18/2019    Order Specific Question:   Has the patient fasted?    Answer:   No  . Ambulatory referral to Pain Clinic    Referral Priority:   Routine    Referral Type:   Consultation    Referral Reason:   Specialty Services Required    Requested Specialty:   Pain Medicine    Number of Visits Requested:   1    Meds  ordered  this encounter  Medications  . DISCONTD: traMADol (ULTRAM) 50 MG tablet    Sig: Take 1 tablet (50 mg total) by mouth every 12 (twelve) hours as needed for severe pain.    Dispense:  60 tablet    Refill:  0  . polyethylene glycol powder (GLYCOLAX/MIRALAX) powder    Sig: Take 17 g by mouth 2 (two) times daily as needed.    Dispense:  3350 g    Refill:  1  . meloxicam (MOBIC) 15 MG tablet    Sig: Take 1 tablet (15 mg total) by mouth daily.    Dispense:  30 tablet    Refill:  0   See phone note. Tramadol cancelled via phone, spoke wit Pharmacist.    The patient was given clear instructions to go to ER or return to medical center if symptoms don't improve, worsen or new problems develop. The patient verbalized understanding. The patient was advised  to call and obtain lab results if they haven't heard anything from out office within 7-10 business days.  Joaquin Courts, FNP Primary Care at [facility] [address], [place] [address] [phone]: [phone]    This note has been created with Dragon speech recognition software and Paediatric nurse. Any transcriptional errors are unintentional.

## 2018-03-18 ENCOUNTER — Other Ambulatory Visit: Payer: Medicaid Other

## 2018-03-18 ENCOUNTER — Telehealth: Payer: Self-pay | Admitting: Family Medicine

## 2018-03-18 ENCOUNTER — Other Ambulatory Visit: Payer: Self-pay | Admitting: Family Medicine

## 2018-03-18 ENCOUNTER — Telehealth: Payer: Self-pay

## 2018-03-18 DIAGNOSIS — F418 Other specified anxiety disorders: Secondary | ICD-10-CM

## 2018-03-18 NOTE — Telephone Encounter (Signed)
Daniel Adams,   Please review see notes from encounter on yesterday. Patient had requested to establish here however, when asked to give urine for UDS he refused and refused to sign a new medication contract. He was advised, that he can continue with current PCP, Dr. Jillyn Hidden if he would not be willing to sign contract with me and agree to wean down from benzodiazapine given his history of medication abuse. As of yesterday, he had a prescription for Clonazepam, uncertain why this medication is being requested today while on yesterday, he and wife stated he had enough medication.    I will have Montel Clock reach out to patient to reiterate discussion yesterday and discuss options.     Joaquin Courts, FNP Primary Care at [facility] [address] [place] [address] [phone]: [phone]

## 2018-03-18 NOTE — Telephone Encounter (Signed)
Someone called on the behalf of the patient due to the patient being in distress with a medication reaction. Please follow up.

## 2018-03-18 NOTE — Telephone Encounter (Signed)
Left VM to call back 

## 2018-03-18 NOTE — Telephone Encounter (Signed)
     (  Front desk staff member called saying person named Carra calling about a medication reaction.  When connected, wife said it was not a reaction but a refill.)        Wife Charlynne Pander requested assistance with the medication refill for Clonazepam 1mg . She said she has weaned him down from 1 1/2   at bedtime to 1 tablet hs. Her concern is that if MD decides to not refill and he may have problems; she said she has read that sudden cessation from benzos can cause medical problems.  Wife said if that was the case then she could wean him off, or if the provider preferred, she could talk to the psychiatrist about being the ordering provider.      She also said pt was asking her to request a refill for tramadol and she told her husband that they may not want to order a benzo and pain medicine together but she said that she would ask. for a Tramadol refill.

## 2018-03-19 ENCOUNTER — Other Ambulatory Visit: Payer: Self-pay

## 2018-03-19 NOTE — Telephone Encounter (Signed)
Patient has decided to continue seeing Dr. Jillyn Hidden.

## 2018-03-19 NOTE — Telephone Encounter (Signed)
Pt last seen: 02/24/18 by Joaquin Courts Next appt: 06/02/18 Last RX written on: 01/23/18 Date of original fill: 01/23/18 Date of refill(s): 02/19/18  During this time the patient also filled and refilled Tramadol 50mg  on the same dates(01/23/18 & 02/19/18) and the RX was written by you on the same date as the clonazepam (01/23/18).    Please refill if appropriate.

## 2018-03-19 NOTE — Telephone Encounter (Signed)
There is also a phone message from patient's wife that I believe was entered yesterday.  Wife mentioned that patient has a psychiatrist therefore wife/patient should be contacted to have future refills of clonazepam handled by patient's psychiatrist

## 2018-03-19 NOTE — Telephone Encounter (Signed)
Please review notes on system. Patient refused to sign medication contract with and submit to urine drug screen, therefore he was given the option to continue with Dr. Jillyn Hidden or sign new contract and provide urine drug screen.  Declining refill of medication.

## 2018-03-21 ENCOUNTER — Other Ambulatory Visit: Payer: Self-pay | Admitting: Family Medicine

## 2018-03-21 DIAGNOSIS — G8929 Other chronic pain: Secondary | ICD-10-CM

## 2018-03-21 DIAGNOSIS — M5440 Lumbago with sciatica, unspecified side: Principal | ICD-10-CM

## 2018-03-21 DIAGNOSIS — F418 Other specified anxiety disorders: Secondary | ICD-10-CM

## 2018-03-21 NOTE — Telephone Encounter (Signed)
I read the notes in the patients chart that Selena Batten wrote about not writing RX's for the patient, I also see the mention of them seeking care from a psychiatrist to handle the Clonazepam. I'm assuming we are not refilling this patient's medications but I want to confirm that with you before declining the refills. I will be leaving the office at 2pm today so if the patient should be called please forward to someone who is still in the clinic.

## 2018-04-14 ENCOUNTER — Encounter: Payer: Self-pay | Admitting: Neurology

## 2018-05-26 ENCOUNTER — Encounter

## 2018-05-26 ENCOUNTER — Encounter: Payer: Self-pay | Admitting: Neurology

## 2018-05-26 ENCOUNTER — Ambulatory Visit: Payer: Medicaid Other | Admitting: Neurology

## 2018-05-26 ENCOUNTER — Other Ambulatory Visit: Payer: Self-pay

## 2018-05-26 VITALS — BP 142/84 | HR 81 | Ht 62.0 in | Wt 138.0 lb

## 2018-05-26 DIAGNOSIS — R569 Unspecified convulsions: Secondary | ICD-10-CM

## 2018-05-26 NOTE — Progress Notes (Signed)
NEUROLOGY CONSULTATION NOTE  Daniel Adams Bechtel MRN: [id number] DOB: June 03, 1960  Referring provider: Dr. Mikael Sprayobert Foster Primary [facility]  Reason for consult:  seizures  Dear Dr Malen GauzeFoster:  Thank you for your kind referral of Daniel Adams [place]i for consultation of the above symptoms. Although his history is well known to you, please allow me to reiterate it for the purpose of our medical record. The patient was accompanied to the clinic by his wife who also provides collateral information. Records and images were personally reviewed where available.  HISTORY OF PRESENT ILLNESS:  This is a pleasant 57 year old right-handed man with a history of hypertension, depression, chronic pain, presenting for evaluation of seizure. Records were reviewed, he was evaluated by neurologist Dr. Doreen BeamMunger-Clary at [facility] in October 2014 seeking driving form completion. He and his wife report that the first seizure occurred around 2000 or 2001 in the setting of heavy alcohol use and Wellbutrin. Seizures stopped with discontinuation of Wellbutrin. He was started on Neurontin at that time by his psychiatrist and stopped in 2008. Seizures recurred in the setting of alcohol withdrawal, he had around 3 and was treated with intermittent lorazepam after the seizures, last seizure was in 2006 or 2007. He stopped drinking heavily and was seizure-free for more than 10 years until he travelled to [place] last March 2019. His wife reports that he was having sleep difficulties and was taking lorazepam purchased from [place] to help sleep, and was also taking Tramadol for his chronic pain. His wife was told by family that he had 30 seizures while he was in [place] and hospitalized twice. He has not prior warning, he fell to the floor a few times, other times family would catch him. He was told he would convulse for 2-3 times, no focal weakness, tongue bite or incontinence. He was underwent detox in [place] and was briefly treated  with Methadone. He reports seeing a Psychiatrist who did an EEG and told him "everything looks fine," started him on Depakote DR 500mg  TID and Sertraline. He reports that seizures stopped after he put the lorazepam and Tramadol away, last seizure was 7-8 months ago. His wife denies any staring/unresponsive episodes, he denies any gaps in time, olfactory/gustatory hallucinations, deja vu, rising epigastric sensation, focal numbness/tingling/weakness, myoclonic jerks. He has occasional mild headaches at the vertex usually at the end of the day. No dizziness, diplopia, dysarthria/dysphagia, neck pain, focal numbness/tingling/weakness, bladder dysfunction. He has chronic back, right leg/hip/knee pain and follows closely with Pain Management. No falls. His wife reports a history of depression and unipolar disorder diagnosis, he previously refused to take antidepressants, but she has noticed that since coming back from [place] last August on Depakote and Sertraline, she has seen a big improvement in his mood. His brother had a few seizures in adulthood and does not take seizure medication. He had a normal birth and early development, no history of febrile seizures, CNS infections, significant head injuries, neurosurgical procedures.   Prior workup [facility]: EEG: normal per pt report, [place] [address] [place] [facility] Had brain MRI in [place] that was normal per pt report  PAST MEDICAL HISTORY: Past Medical History:  Diagnosis Date  . Arthritis   . Depression   . GERD (gastroesophageal reflux disease)   . H. pylori infection    DX blood test   . Hypertension   . Seizure disorder (HCC)    alcohol withdrawal seizures     PAST  SURGICAL HISTORY: Past Surgical History:  Procedure Laterality Date  . KNEE ARTHROSCOPY      MEDICATIONS: Current Outpatient Medications on File Prior to Visit  Medication Sig Dispense Refill  . amLODipine (NORVASC) 5 MG tablet Take 1  tablet (5 mg total) by mouth daily. 90 tablet 3  . buprenorphine (BUTRANS - DOSED MCG/HR) 10 MCG/HR PTWK patch Place 10 mcg onto the skin once a week.    . divalproex (DEPAKOTE) 500 MG DR tablet Take 1 tablet (500 mg total) by mouth 3 (three) times daily. 90 tablet 6  . hydrOXYzine (ATARAX/VISTARIL) 25 MG tablet Take 25 mg by mouth 3 (three) times daily as needed.    Marland Kitchen oxycodone (OXY-IR) 5 MG capsule Take 5 mg by mouth every 6 (six) hours as needed.    . propranolol (INDERAL) 10 MG tablet Take 10 mg by mouth 3 (three) times daily.    . sertraline (ZOLOFT) 50 MG tablet Take 1 tablet (50 mg total) by mouth daily. 30 tablet 6  . Suvorexant (BELSOMRA PO) Take by mouth.    . traZODone (DESYREL) 100 MG tablet Take 100 mg by mouth at bedtime.    . polyethylene glycol powder (GLYCOLAX/MIRALAX) powder Take 17 g by mouth 2 (two) times daily as needed. 3350 g 1  . [DISCONTINUED] gemfibrozil (LOPID) 600 MG tablet Take 600 mg by mouth 2 (two) times daily before a meal.      . [DISCONTINUED] lisinopril (PRINIVIL,ZESTRIL) 20 MG tablet Take 20 mg by mouth daily.      . [DISCONTINUED] venlafaxine (EFFEXOR-XR) 150 MG 24 hr capsule Take 150 mg by mouth daily.       No current facility-administered medications on file prior to visit.     ALLERGIES: Allergies  Allergen Reactions  . Wellbutrin [Bupropion Hcl] Other (See Comments)    seizures    FAMILY HISTORY: Family History  Problem Relation Age of Onset  . Colon cancer Paternal Aunt     SOCIAL HISTORY: Social History   Socioeconomic History  . Marital status: Married    Spouse name: Not on file  . Number of children: 1  . Years of education: Not on file  . Highest education level: Not on file  Occupational History  . Occupation: Unemployed  Social Needs  . Financial resource strain: Not on file  . Food insecurity:    Worry: Not on file    Inability: Not on file  . Transportation needs:    Medical: Not on file    Non-medical: Not on file    Tobacco Use  . Smoking status: Current Every Day Smoker    Types: Cigarettes  . Smokeless tobacco: Never Used  . Tobacco comment: Smoking 3-4 cigs/ day  Substance and Sexual Activity  . Alcohol use: No    Alcohol/week: 0.0 standard drinks    Comment: Former  . Drug use: No  . Sexual activity: Not on file  Lifestyle  . Physical activity:    Days per week: Not on file    Minutes per session: Not on file  . Stress: Not on file  Relationships  . Social connections:    Talks on phone: Not on file    Gets together: Not on file    Attends religious service: Not on file    Active member of club or organization: Not on file    Attends meetings of clubs or organizations: Not on file    Relationship status: Not on file  . Intimate partner violence:  Fear of current or ex partner: Not on file    Emotionally abused: Not on file    Physically abused: Not on file    Forced sexual activity: Not on file  Other Topics Concern  . Not on file  Social History Narrative   From [place]     REVIEW OF SYSTEMS: Constitutional: No fevers, chills, or sweats, no generalized fatigue, change in appetite Eyes: No visual changes, double vision, eye pain Ear, nose and throat: No hearing loss, ear pain, nasal congestion, sore throat Cardiovascular: No chest pain, palpitations Respiratory:  No shortness of breath at rest or with exertion, wheezes GastrointestinaI: No nausea, vomiting, diarrhea, abdominal pain, fecal incontinence Genitourinary:  No dysuria, urinary retention or frequency Musculoskeletal:  No neck pain, +back pain Integumentary: No rash, pruritus, skin lesions Neurological: as above Psychiatric: + depression,no insomnia, anxiety Endocrine: No palpitations, fatigue, diaphoresis, mood swings, change in appetite, change in weight, increased thirst Hematologic/Lymphatic:  No anemia, purpura, petechiae. Allergic/Immunologic: no itchy/runny eyes, nasal congestion, recent allergic reactions,  rashes  PHYSICAL EXAM: Vitals:   05/26/18 1040  BP: (!) 142/84  Pulse: 81  SpO2: 98%   General: No acute distress Head:  Normocephalic/atraumatic Eyes: Fundoscopic exam shows bilateral sharp discs, no vessel changes, exudates, or hemorrhages Neck: supple, no paraspinal tenderness, full range of motion Back: No paraspinal tenderness Heart: regular rate and rhythm Lungs: Clear to auscultation bilaterally. Vascular: No carotid bruits. Skin/Extremities: No rash, no edema Neurological Exam: Mental status: alert and oriented to person, place, and time, no dysarthria or aphasia, Fund of knowledge is appropriate.  Recent and remote memory are intact.  Attention and concentration are normal.    Able to name objects and repeat phrases. Cranial nerves: CN I: not tested CN II: pupils equal, round and reactive to light, visual fields intact, fundi unremarkable. CN III, IV, VI:  full range of motion, no nystagmus, no ptosis CN V: facial sensation intact CN VII: upper and lower face symmetric CN VIII: hearing intact to finger rub CN IX, X: gag intact, uvula midline CN XI: sternocleidomastoid and trapezius muscles intact CN XII: tongue midline Bulk & Tone: normal, no fasciculations. Motor: 5/5 throughout with no pronator drift. Sensation: intact to light touch, cold, pin, vibration and joint position sense.  No extinction to double simultaneous stimulation.  Romberg test negative Deep Tendon Reflexes: +2 throughout, no ankle clonus Plantar responses: downgoing bilaterally Cerebellar: no incoordination on finger to nose testing Gait: narrow-based, favoring right leg due to right knee pain, no ataxia Tremor: none  IMPRESSION: This is a pleasant 57 year old right-handed man with a history of provoked seizures 19 years ago in the setting of alcohol abuse/withdrawal, seizure-free for more than 10 years until last March 2019 when he reported had up to 30 seizures in the setting of Tramadol and  lorazepam use. He has been seizure-free for 7-8 months off these medications, however was started on Depakote DR 500mg  TID while admitted in [place]. He reportedly had an EEG there that was normal, they will bring the report for review. We discussed that seizures appear provoked, however with recurrent seizures, the possibility of an underlying tendency for seizures (ie epilepsy) triggered by these substances is also considered. A 24-hour EEG will be ordered to further classify seizures. If normal, we will plan to reduce Depakote DR to 500mg  BID, it appears this was prescribed by a psychiatrist and was possibly for mood stabilization rather than seizures, so would opt to keep him on medication for  mood anyway. We again discussed avoidance of seizure triggers, including alcohol and Tramadol. [place] driving laws were discussed with the patient, and he knows to stop driving after a seizure, until 6 months seizure-free. Follow-up in 6 months, they know to call for any changes.   Thank you for allowing me to participate in the care of this patient. Please do not hesitate to call for any questions or concerns.   Patrcia Dolly, M.D.  CC: Dr. Malen Gauze, [facility]

## 2018-05-26 NOTE — Patient Instructions (Addendum)
1. Schedule 24-hour EEG  You will receive a call from our EEG Tech, Darl PikesSusan, to schedule your AMBULATORY 24 HOUR EEG.   2. Continue all your current medications for now. If EEG is normal, we can plan to reduce Depakote to twice a day 3. Follow-up in 6 months, call for any changes  Seizure Precautions: 1. If medication has been prescribed for you to prevent seizures, take it exactly as directed.  Do not stop taking the medicine without talking to your doctor first, even if you have not had a seizure in a long time.   2. Avoid activities in which a seizure would cause danger to yourself or to others.  Don't operate dangerous machinery, swim alone, or climb in high or dangerous places, such as on ladders, roofs, or girders.  Do not drive unless your doctor says you may.  3. If you have any warning that you may have a seizure, lay down in a safe place where you can't hurt yourself.    4.  No driving for 6 months from last seizure, as [place].   Please refer to the following link on the Epile[employer] of America's website for more information: http://www.epilepsyfoundation.org/answerplace/Social/driving/drivingu.cfm   5.  Maintain good sleep hygiene. Avoid alcohol and Tramadol.  6.  Contact your doctor if you have any problems that may be related to the medicine you are taking.  7.  Call 911 and bring the patient back to the ED if:        A.  The seizure lasts longer than 5 minutes.       B.  The patient doesn't awaken shortly after the seizure  C.  The patient has new problems such as difficulty seeing, speaking or moving  D.  The patient was injured during the seizure  E.  The patient has a temperature over 102 F (39C)  F.  The patient vomited and now is having trouble breathing

## 2018-06-02 ENCOUNTER — Ambulatory Visit: Payer: Medicaid Other | Admitting: Family Medicine

## 2018-06-11 ENCOUNTER — Ambulatory Visit (INDEPENDENT_AMBULATORY_CARE_PROVIDER_SITE_OTHER): Payer: Medicaid Other | Admitting: Neurology

## 2018-06-11 DIAGNOSIS — R569 Unspecified convulsions: Secondary | ICD-10-CM | POA: Diagnosis not present

## 2018-06-17 ENCOUNTER — Ambulatory Visit: Payer: Medicaid Other | Admitting: Family Medicine

## 2018-06-17 NOTE — Procedures (Signed)
ELECTROENCEPHALOGRAM REPORT  Dates of Recording: 06/11/2018 1:38PM to 06/12/2018 1:40PM  Patient's Name: Daniel Adams MRN: [id number] Date of Birth: Aug 23, 1960  Referring Provider: Dr. Patrcia Dolly  Procedure: 24-hour ambulatory EEG  History: This is a 58 year old man with a history of provoked seizures, most recently in March 2019 in the setting of Tramadol and lorazepam use. He was started on Depakote presumably for mood.  Medications:  DEPAKOTE 500 MG DR tablet  NORVASC 5 MG tablet  BUTRANS - DOSED MCG/HR 10 MCG/HR PTWK patch   ATARAX/VISTARIL 25 MG tablet    OXY-IR 5 MG capsule  INDERAL 10 MG tablet    ZOLOFT 50 MG tablet BELSOMRA PO DESYREL 100 MG tablet   Technical Summary: This is a 24-hour multichannel digital EEG recording with video measured by the international 10-20 system with electrodes applied with paste and impedances below 5000 ohms performed as portable with EKG monitoring.  The digital EEG was referentially recorded, reformatted, and digitally filtered in a variety of bipolar and referential montages for optimal display.    DESCRIPTION OF RECORDING: During maximal wakefulness, the background activity consisted of a symmetric 10Hz  low voltage posterior dominant rhythm which was reactive to eye opening.  There were no epileptiform discharges or focal slowing seen in wakefulness.  During the recording, the patient progresses through wakefulness, drowsiness, and Stage 2 sleep.  Again, there were no epileptiform discharges seen.  Events: There were several push button events for activities, no symptoms reported on diary.  There were no electrographic seizures seen.  EKG lead was unremarkable.  IMPRESSION: This 24-hour ambulatory video EEG study is normal.    CLINICAL CORRELATION: A normal EEG does not exclude a clinical diagnosis of epilepsy.  If further clinical questions remain, inpatient video EEG monitoring may be helpful.   Patrcia Dolly, M.D.

## 2018-07-07 ENCOUNTER — Ambulatory Visit: Payer: Medicaid Other | Admitting: Neurology

## 2018-12-24 ENCOUNTER — Ambulatory Visit: Payer: Medicaid Other | Admitting: Neurology

## 2018-12-26 ENCOUNTER — Other Ambulatory Visit: Payer: Self-pay

## 2018-12-26 ENCOUNTER — Ambulatory Visit (INDEPENDENT_AMBULATORY_CARE_PROVIDER_SITE_OTHER): Payer: Medicaid Other | Admitting: Neurology

## 2018-12-26 ENCOUNTER — Encounter: Payer: Self-pay | Admitting: Neurology

## 2018-12-26 VITALS — BP 136/91 | HR 73 | Ht 62.0 in | Wt 136.4 lb

## 2018-12-26 DIAGNOSIS — R569 Unspecified convulsions: Secondary | ICD-10-CM

## 2018-12-26 NOTE — Progress Notes (Signed)
NEUROLOGY FOLLOW UP OFFICE NOTE  Daniel StandardSeyed Adams [id number] 08/30/1960  HISTORY OF PRESENT ILLNESS: I had the pleasure of seeing Daniel StandardSeyed Konkel in follow-up in the neurology clinic on 12/26/2018.  The patient was last seen 7 months ago for seizures. He reportedly had 30 seizures while visiting [place] in March 2019, at that time he was on lorazepam and Tramadol. He underwent Detox. He was started by Psychiatry on Depakote DR 500mg  TID and Sertraline. He had a 24-hour EEG in January 2020 which was normal. He has not had any seizures in 15 months. He denies any staring/unresponsive episodes, gaps in time, olfactory/gustatory hallucinations, focal numbness/tingling/weakness, myoclonic jerks. He denies any headaches, dizziness, vision changes, no falls. He has not had any alcohol in many years. Sleep is up and down, improving. He continues to follow-up with Psychiatry and expressed interest in reducing Depakote. He is also asking about getting his Driver's License back.   History on Initial Assessment 05/26/2018: This is a pleasant 58 year old right-handed man with a history of hypertension, depression, chronic pain, presenting for evaluation of seizure. Records were reviewed, he was evaluated by neurologist Dr. Doreen BeamMunger-Clary at [facility] in October 2014 seeking driving form completion. He and his wife report that the first seizure occurred around 2000 or 2001 in the setting of heavy alcohol use and Wellbutrin. Seizures stopped with discontinuation of Wellbutrin. He was started on Neurontin at that time by his psychiatrist and stopped in 2008. Seizures recurred in the setting of alcohol withdrawal, he had around 3 and was treated with intermittent lorazepam after the seizures, last seizure was in 2006 or 2007. He stopped drinking heavily and was seizure-free for more than 10 years until he travelled to [place] last March 2019. His wife reports that he was having sleep difficulties and was taking lorazepam purchased from  [place] to help sleep, and was also taking Tramadol for his chronic pain. His wife was told by family that he had 30 seizures while he was in [place] and hospitalized twice. He has not prior warning, he fell to the floor a few times, other times family would catch him. He was told he would convulse for 2-3 times, no focal weakness, tongue bite or incontinence. He was underwent detox in [place] and was briefly treated with Methadone. He reports seeing a Psychiatrist who did an EEG and told him "everything looks fine," started him on Depakote DR 500mg  TID and Sertraline. He reports that seizures stopped after he put the lorazepam and Tramadol away, last seizure was 7-8 months ago. His wife denies any staring/unresponsive episodes, he denies any gaps in time, olfactory/gustatory hallucinations, deja vu, rising epigastric sensation, focal numbness/tingling/weakness, myoclonic jerks. He has occasional mild headaches at the vertex usually at the end of the day. No dizziness, diplopia, dysarthria/dysphagia, neck pain, focal numbness/tingling/weakness, bladder dysfunction. He has chronic back, right leg/hip/knee pain and follows closely with Pain Management. No falls. His wife reports a history of depression and unipolar disorder diagnosis, he previously refused to take antidepressants, but she has noticed that since coming back from [place] last August on Depakote and Sertraline, she has seen a big improvement in his mood. His brother had a few seizures in adulthood and does not take seizure medication. He had a normal birth and early development, no history of febrile seizures, CNS infections, significant head injuries, neurosurgical procedures.  Prior [facility]up [facility]: EEG: normal per pt report, done on [address] [place] [facility] 24-hour EEG in  January 2020 was normal.  Had brain MRI in [place] that was normal per pt report  PAST MEDICAL HISTORY: Past Medical History:  Diagnosis  Date  . Arthritis   . Depression   . GERD (gastroesophageal reflux disease)   . H. pylori infection    DX blood test   . Hypertension   . Seizure disorder (HCC)    alcohol withdrawal seizures     MEDICATIONS: Current Outpatient Medications on File Prior to Visit  Medication Sig Dispense Refill  . amLODipine (NORVASC) 5 MG tablet Take 1 tablet (5 mg total) by mouth daily. 90 tablet 3  . buprenorphine (BUTRANS - DOSED MCG/HR) 10 MCG/HR PTWK patch Place 10 mcg onto the skin once a week.    . divalproex (DEPAKOTE) 500 MG DR tablet Take 1 tablet (500 mg total) by mouth 3 (three) times daily. 90 tablet 6  . hydrOXYzine (ATARAX/VISTARIL) 25 MG tablet Take 25 mg by mouth 3 (three) times daily as needed.    Marland Kitchen. oxycodone (OXY-IR) 5 MG capsule Take 5 mg by mouth every 6 (six) hours as needed.    . polyethylene glycol powder (GLYCOLAX/MIRALAX) powder Take 17 g by mouth 2 (two) times daily as needed. 3350 g 1  . propranolol (INDERAL) 10 MG tablet Take 10 mg by mouth 3 (three) times daily.    . sertraline (ZOLOFT) 50 MG tablet Take 1 tablet (50 mg total) by mouth daily. 30 tablet 6  . Suvorexant (BELSOMRA PO) Take by mouth.    . traZODone (DESYREL) 100 MG tablet Take 100 mg by mouth at bedtime.    . [DISCONTINUED] gemfibrozil (LOPID) 600 MG tablet Take 600 mg by mouth 2 (two) times daily before a meal.      . [DISCONTINUED] lisinopril (PRINIVIL,ZESTRIL) 20 MG tablet Take 20 mg by mouth daily.      . [DISCONTINUED] venlafaxine (EFFEXOR-XR) 150 MG 24 hr capsule Take 150 mg by mouth daily.       No current facility-administered medications on file prior to visit.     ALLERGIES: Allergies  Allergen Reactions  . Wellbutrin [Bupropion Hcl] Other (See Comments)    seizures    FAMILY HISTORY: Family History  Problem Relation Age of Onset  . Colon cancer Paternal Aunt     SOCIAL HISTORY: Social History   Socioeconomic History  . Marital status: Married    Spouse name: Not on file  . Number  of children: 1  . Years of education: Not on file  . Highest education level: Not on file  Occupational History  . Occupation: Unemployed  Social Needs  . Financial resource strain: Not on file  . Food insecurity    Worry: Not on file    Inability: Not on file  . Transportation needs    Medical: Not on file    Non-medical: Not on file  Tobacco Use  . Smoking status: Current Every Day Smoker    Types: Cigarettes  . Smokeless tobacco: Never Used  . Tobacco comment: Smoking 3-4 cigs/ day  Substance and Sexual Activity  . Alcohol use: No    Alcohol/week: 0.0 Adams drinks    Comment: Former  . Drug use: No  . Sexual activity: Not on file  Lifestyle  . Physical activity    Days per week: Not on file    Minutes per session: Not on file  . Stress: Not on file  Relationships  . Social connections    Talks on phone: Not on file  Gets together: Not on file    Attends religious service: Not on file    Active member of club or organization: Not on file    Attends meetings of clubs or organizations: Not on file    Relationship status: Not on file  . Intimate partner violence    Fear of current or ex partner: Not on file    Emotionally abused: Not on file    Physically abused: Not on file    Forced sexual activity: Not on file  Other Topics Concern  . 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 [employer]   Unemployed since 2011    REVIEW OF SYSTEMS: Constitutional: No fevers, chills, or sweats, no generalized fatigue, change in appetite Eyes: No visual changes, double vision, eye pain Ear, nose and throat: No hearing loss, ear pain, nasal congestion, sore throat Cardiovascular: No chest pain, palpitations Respiratory:  No shortness of breath at rest or with exertion, wheezes GastrointestinaI: No nausea, vomiting,  diarrhea, abdominal pain, fecal incontinence Genitourinary:  No dysuria, urinary retention or frequency Musculoskeletal:  No neck pain, back pain Integumentary: No rash, pruritus, skin lesions Neurological: as above Psychiatric: + depression, insomnia, anxiety Endocrine: No palpitations, fatigue, diaphoresis, mood swings, change in appetite, change in weight, increased thirst Hematologic/Lymphatic:  No anemia, purpura, petechiae. Allergic/Immunologic: no itchy/runny eyes, nasal congestion, recent allergic reactions, rashes  PHYSICAL EXAM: Vitals:   12/26/18 1440  BP: (!) 136/91  Pulse: 73  SpO2: 97%   General: No acute distress Head:  Normocephalic/atraumatic Skin/Extremities: No rash, no edema Neurological Exam: alert and oriented to person, place, and time. No aphasia or dysarthria. Fund of knowledge is appropriate.  Recent and remote memory are intact.  Attention and concentration are normal.    Able to name objects and repeat phrases. Cranial nerves: Pupils equal, round, reactive to light. Extraocular movements intact with no nystagmus. Visual fields full. No facial asymmetry. Motor: Bulk and tone normal, muscle strength 5/5 throughout with no pronator drift. Finger to nose testing intact.  Gait narrow-based and steady, able to tandem walk adequately.  Romberg negative.  IMPRESSION: This is a pleasant 57 yo RH man with a history of provoked seizures 19 years ago in the setting of alcohol abuse/withdrawal, seizure-free for more than 10 years until last March 2019 when he reported had up to 30 seizures in the setting of Tramadol and lorazepam use. He continues to be seizure-free since March 2019, his 24-hour EEG was normal. He was started on Depakote 500mg  TID by the Psychiatrist in [place], he continues to follow-up with Psychiatry locally and expressed interest in reducing Depakote. From a seizure standpoint, can reduce Depakote to BID but continue to monitor mood with Psychiatry. If seizures  recur without triggers, call our office. He is aware of [place] driving laws to stop driving after a seizure, until 6 months seizure-free. DMV forms will be filled out for him, his Psychiatrist will need to fill out their part as well. Follow-up in 6-8 months, he knows to call for any changes.   Thank you for allowing me to participate in his care.  Please do not hesitate to call for any questions or concerns.   Ellouise Newer, M.D.   CC: [facility]

## 2018-12-26 NOTE — Patient Instructions (Signed)
Your brain wave test was normal. You can reduce the Depakote to twice a day, please discuss this with your psychiatrist as well. We will fill out the [facility] DMV forms so that you can hopefully return to driving after they review the forms. As per [place] driving laws, no driving until 6 months seizure-free. Follow-up in 6-8 months, call for any changes  Seizure Precautions: 1. If medication has been prescribed for you to prevent seizures, take it exactly as directed.  Do not stop taking the medicine without talking to your doctor first, even if you have not had a seizure in a long time.   2. Avoid activities in which a seizure would cause danger to yourself or to others.  Don't operate dangerous machinery, swim alone, or climb in high or dangerous places, such as on ladders, roofs, or girders.  Do not drive unless your doctor says you may.  3. If you have any warning that you may have a seizure, lay down in a safe place where you can't hurt yourself.    4.  No driving for 6 months from last seizure, as per [facility].   Please refer to the following link on the [place] website for more information: http://[email or web address].org/[email or web address]/driving/drivingu.cfm   5.  Maintain good sleep hygiene. Avoid alcohol.  6.  Contact your doctor if you have any problems that may be related to the medicine you are taking.  7.  Call 911 and bring the patient back to the ED if:        A.  The seizure lasts longer than 5 minutes.       B.  The patient doesn't awaken shortly after the seizure  C.  The patient has new problems such as difficulty seeing, speaking or moving  D.  The patient was injured during the seizure  E.  The patient has a temperature over 102 F (39C)  F.  The patient vomited and now is having trouble breathing

## 2019-01-09 ENCOUNTER — Telehealth: Payer: Self-pay

## 2019-01-09 NOTE — Telephone Encounter (Signed)
Forms completed by Dr. Delice Lesch today.  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 Forms sent to be scanned, one copy placed in saved faxes folder and originals mailed to pts home address.

## 2019-07-24 ENCOUNTER — Other Ambulatory Visit: Payer: Self-pay

## 2019-07-24 ENCOUNTER — Encounter: Payer: Self-pay | Admitting: Neurology

## 2019-07-24 ENCOUNTER — Telehealth (INDEPENDENT_AMBULATORY_CARE_PROVIDER_SITE_OTHER): Payer: Medicaid Other | Admitting: Neurology

## 2019-07-24 DIAGNOSIS — R569 Unspecified convulsions: Secondary | ICD-10-CM

## 2019-07-24 NOTE — Progress Notes (Signed)
Telephone (Audio) Visit The purpose of this telephone visit is to provide medical care while limiting exposure to the novel coronavirus.    Consent was obtained for telephone visit:  Yes.   Answered questions that patient had about telehealth interaction:  Yes.   I discussed the limitations, risks, security and privacy concerns of performing an evaluation and management service by telephone. I also discussed with the patient that there may be a patient responsible charge related to this service. The patient expressed understanding and agreed to proceed.  Pt location: Home Physician Location: office Name of referring provider:  [facility] [place] connected with .Michaelle Copas at patients initiation/request on 07/24/2019 at  4:00 PM EST by telephone and verified that I am speaking with the correct person using two identifiers.  Pt MRN:  [id number] Pt DOB:  11-03-1960   History of Present Illness:  The patient had a telephone visit on 08/10/2019. He was last seen in the neurology clinic 8 months ago for seizures. He reportedly had 30 seizures while visiting [place] in March 2019, at that time he was on lorazepam and Tramadol. He underwent Detox. He was started by Psychiatry on Depakote DR 500mg  TID and Sertraline. He had a 24-hour EEG in January 2020 which was normal. On his last visit, we discussed reducing Depakote to BID, he has done well with no seizure recurrence. Mood has been good. His wife Moshe Salisbury is present during the visit and denies any staring/unresponsive episodes. He reprots doing well. Sleep is still an issue but has improved a little. He denies any headaches, dizziness, no falls.    History on Initial Assessment 05/26/2018: This is a pleasant 59 year old right-handed man with a history of hypertension, depression, chronic pain, presenting for evaluation of seizure. Records were reviewed, he was evaluated by neurologist Dr. Assunta Found at [facility] in October 2014 seeking driving form  completion. He and his wife report that the first seizure occurred around 2000 or 2001 in the setting of heavy alcohol use and Wellbutrin. Seizures stopped with discontinuation of Wellbutrin. He was started on Neurontin at that time by his psychiatrist and stopped in 2008. Seizures recurred in the setting of alcohol withdrawal, he had around 3 and was treated with intermittent lorazepam after the seizures, last seizure was in 2006 or 2007. He stopped drinking heavily and was seizure-free for more than 10 years until he travelled to [place] last March 2019. His wife reports that he was having sleep difficulties and was taking lorazepam purchased from [place] to help sleep, and was also taking Tramadol for his chronic pain. His wife was told by family that he had 14 seizures while he was in [place] and hospitalized twice. He has not prior warning, he fell to the floor a few times, other times family would catch him. He was told he would convulse for 2-3 times, no focal weakness, tongue bite or incontinence. He was underwent detox in [place] and was briefly treated with Methadone. He reports seeing a Psychiatrist who did an EEG and told him "everything looks fine," started him on Depakote DR 500mg  TID and Sertraline. He reports that seizures stopped after he put the lorazepam and Tramadol away, last seizure was 7-8 months ago. His wife denies any staring/unresponsive episodes, he denies any gaps in time, olfactory/gustatory hallucinations, deja vu, rising epigastric sensation, focal numbness/tingling/weakness, myoclonic jerks. He has occasional mild headaches at the vertex usually at the end of the day. No dizziness, diplopia, dysarthria/dysphagia, neck pain, focal numbness/tingling/weakness,  bladder dysfunction. He has chronic back, right leg/hip/knee pain and follows closely with Pain Management. No falls. His wife reports a history of depression and unipolar disorder diagnosis, he previously refused to take antidepressants, but  she has noticed that since coming back from [place] last August on Depakote and Sertraline, she has seen a big improvement in his mood. His brother had a few seizures in adulthood and does not take seizure medication. He had a normal birth and early development, no history of febrile seizures, CNS infections, significant head injuries, neurosurgical procedures.  Prior workup per [facility] records: EEG: normal per pt report, done on [address] in [place], [place] have been [facility] 24-hour EEG in January 2020 was normal.  Had brain MRI in [place] that was normal per pt report    MEDICATIONS: Current Outpatient Medications on File Prior to Visit  Medication Sig Dispense Refill  . amLODipine (NORVASC) 5 MG tablet Take 1 tablet (5 mg total) by mouth daily. 90 tablet 3  . Buprenorphine HCl (BELBUCA) 900 MCG FILM Place 900 mcg inside cheek 2 (two) times a day.    . divalproex (DEPAKOTE) 500 MG DR tablet Take 1 tablet (500 mg total) by mouth 2 (three) times daily. 90 tablet 6  . oxycodone (OXY-IR) 5 MG capsule Take 5 mg by mouth every 6 (six) hours as needed.    . polyethylene glycol powder (GLYCOLAX/MIRALAX) powder Take 17 g by mouth 2 (two) times daily as needed. 3350 g 1  . propranolol (INDERAL) 10 MG tablet Take 10 mg by mouth 3 (three) times daily.    . QUEtiapine (SEROQUEL) 25 MG tablet Take 25 mg by mouth at bedtime.    . sertraline (ZOLOFT) 100 MG tablet Take 100 mg by mouth daily.    . [DISCONTINUED] gemfibrozil (LOPID) 600 MG tablet Take 600 mg by mouth 2 (two) times daily before a meal.      . [DISCONTINUED] lisinopril (PRINIVIL,ZESTRIL) 20 MG tablet Take 20 mg by mouth daily.      . [DISCONTINUED] venlafaxine (EFFEXOR-XR) 150 MG 24 hr capsule Take 150 mg by mouth daily.       No current facility-administered medications on file prior to visit.    Observations/Objective:   Exam limited due to nature of telephone visit. Patient is awake, alert, able to answer questions without  dysarthria or confusion.  Assessment and Plan:   This is a pleasant 59 yo RH man with a history of provoked seizures 19 years ago in the setting of alcohol abuse/withdrawal, seizure-free for more than 10 years until last March 2019 when he reported had up to 30 seizures in the setting of Tramadol and lorazepam use. His 24-hour EEG was normal. He was started on Depakote 500mg  by the Psychiatrist in , he continues to do well seizure-free since March 2019 on Depakote 500mg  BID. They are interested in further reducing dose to 1 tab daily, since this was started by Psychiatry, he will discuss this with his psychiatrist, but from a neurological standpoint, he may reduce dose. He is planning a trip to [place] on March 10, we discussed possibly holding off on any medication changes until he returns. Follow-up in 6-8 months, he knows to call for any changes.   Follow Up Instructions:   -I discussed the assessment and treatment plan with the patient. The patient was provided an opportunity to ask questions and all were answered. The patient agreed with the plan and demonstrated an understanding of the instructions.   The  patient was advised to call back or seek an in-person evaluation if the symptoms worsen or if the condition fails to improve as anticipated.    Total Time spent in visit with the patient was:  12:27 minutes, of which 100% of the time was spent in counseling and/or coordinating care on the above.   Pt understands and agrees with the plan of care outlined.     Van Clines, MD

## 2020-04-11 ENCOUNTER — Ambulatory Visit: Payer: Medicaid Other | Admitting: Neurology
